# Patient Record
Sex: Female | Born: 1950 | Race: White | Hispanic: No | Marital: Single | Smoking: Never smoker
Health system: Southern US, Community
[De-identification: ages and names within clinical notes are randomized; demographics above are authoritative.]

## PROBLEM LIST (undated history)

## (undated) DIAGNOSIS — R42 Dizziness and giddiness: Secondary | ICD-10-CM

## (undated) DIAGNOSIS — C801 Malignant (primary) neoplasm, unspecified: Secondary | ICD-10-CM

## (undated) DIAGNOSIS — C539 Malignant neoplasm of cervix uteri, unspecified: Secondary | ICD-10-CM

## (undated) DIAGNOSIS — R011 Cardiac murmur, unspecified: Secondary | ICD-10-CM

## (undated) HISTORY — PX: OTHER SURGICAL HISTORY: SHX169

## (undated) HISTORY — PX: ABDOMINAL HYSTERECTOMY: SHX81

---

## 2005-11-15 ENCOUNTER — Emergency Department (HOSPITAL_COMMUNITY): Admission: EM | Admit: 2005-11-15 | Discharge: 2005-11-15 | Payer: Self-pay | Admitting: Emergency Medicine

## 2019-06-04 ENCOUNTER — Emergency Department: Payer: BLUE CROSS/BLUE SHIELD

## 2019-06-04 ENCOUNTER — Emergency Department
Admission: EM | Admit: 2019-06-04 | Discharge: 2019-06-04 | Disposition: A | Discharge status: Home / Self Care | Payer: BLUE CROSS/BLUE SHIELD | Attending: Emergency Medicine | Admitting: Emergency Medicine

## 2019-06-04 ENCOUNTER — Other Ambulatory Visit: Payer: Self-pay

## 2019-06-04 DIAGNOSIS — M79605 Pain in left leg: Secondary | ICD-10-CM | POA: Diagnosis present

## 2019-06-04 MED ORDER — CYCLOBENZAPRINE HCL 10 MG PO TABS
10.0000 mg | ORAL_TABLET | Freq: Once | ORAL | Status: AC
  Administered 2019-06-04: 10 mg via ORAL
  Filled 2019-06-04: qty 1

## 2019-06-04 MED ORDER — KETOROLAC TROMETHAMINE 60 MG/2ML IM SOLN
30.0000 mg | Freq: Once | INTRAMUSCULAR | Status: AC
  Administered 2019-06-04: 23:00:00 30 mg via INTRAMUSCULAR
  Filled 2019-06-04: qty 2

## 2019-06-04 MED ORDER — IBUPROFEN 600 MG PO TABS: 600.0000 mg | ORAL_TABLET | Freq: Three times a day (TID) | ORAL | 0 refills | Status: DC | PRN

## 2019-06-04 MED ORDER — HYDROMORPHONE HCL 1 MG/ML IJ SOLN
1.0000 mg | Freq: Once | INTRAMUSCULAR | Status: AC
  Administered 2019-06-04: 1 mg via INTRAMUSCULAR
  Filled 2019-06-04: qty 1

## 2019-06-04 MED ORDER — TRAMADOL HCL 50 MG PO TABS
50.0000 mg | ORAL_TABLET | Freq: Once | ORAL | Status: AC
  Administered 2019-06-04: 50 mg via ORAL
  Filled 2019-06-04: qty 1

## 2019-06-04 MED ORDER — TRAMADOL HCL 50 MG PO TABS: 50.0000 mg | ORAL_TABLET | Freq: Two times a day (BID) | ORAL | 0 refills | Status: DC | PRN

## 2019-06-04 NOTE — ED Provider Notes (Signed)
[facility] Emergency Department Provider Note   ____________________________________________   First MD Initiated Contact with Patient 06/04/19 2009     (approximate)  I have reviewed the triage vital signs and the nursing notes.   HISTORY  Chief Complaint Leg Pain    HPI Gina Charles is a 68 y.o. female patient presents with increasing anterior leg pain that radiates from foot to thigh.  Patient states pain increased with prolonged standing.  Patient stated no relief with taking anti-inflammatory medication.  Patient denies dyspnea or shortness of breath.  It was noted in triage the patient blood pressure is 195/90.  Patient is no history of hypertension.  We will have the nurse retake blood pressure.  Patient rates her pain as a 10/10.  Patient described the pain is "achy".  No other palliative measure for complaint.         No past medical history on file.  There are no active problems to display for this patient.     Prior to Admission medications   Medication Sig Start Date End Date Taking? Authorizing Provider  ibuprofen (ADVIL) 600 MG tablet Take 1 tablet (600 mg total) by mouth every 8 (eight) hours as needed. 06/04/19   Sable Feil, PA-C  traMADol (ULTRAM) 50 MG tablet Take 1 tablet (50 mg total) by mouth every 12 (twelve) hours as needed. 06/04/19   Sable Feil, PA-C    Allergies Patient has no allergy information on record.  No family history on file.  Social History Social History   Tobacco Use  . Smoking status: Not on file  Substance Use Topics  . Alcohol use: Not on file  . Drug use: Not on file    Review of Systems Constitutional: No fever/chills Eyes: No visual changes. ENT: No sore throat. Cardiovascular: Denies chest pain. Respiratory: Denies shortness of breath. Gastrointestinal: No abdominal pain.  No nausea, no vomiting.  No diarrhea.  No constipation. Genitourinary: Negative for dysuria.  Musculoskeletal: Negative for back pain. Skin: Negative for rash. Neurological: Negative for headaches, focal weakness or numbness.   ____________________________________________   PHYSICAL EXAM:  VITAL SIGNS: ED Triage Vitals  Enc Vitals Group     BP 06/04/19 1943 (!) 195/90     Pulse Rate 06/04/19 1943 82     Resp 06/04/19 1943 20     Temp 06/04/19 1943 98.3 F (36.8 C)     Temp Source 06/04/19 1943 Oral     SpO2 06/04/19 1943 100 %     Weight 06/04/19 1944 175 lb (79.4 kg)     Height 06/04/19 1944 5\' 4"  (1.626 m)     Head Circumference --      Peak Flow --      Pain Score 06/04/19 1944 10     Pain Loc --      Pain Edu? --      Excl. in [place]? --     Constitutional: Alert and oriented. Well appearing and in no acute distress. Cardiovascular: Normal rate, regular rhythm. Grossly normal heart sounds.  Good peripheral circulation.  Elevated blood pressure Respiratory: Normal respiratory effort.  No retractions. Lungs CTAB. Gastrointestinal: Soft and nontender. No distention. No abdominal bruits. No CVA tenderness. Musculoskeletal: No lower extremity tenderness nor edema.  No joint effusions. Neurologic:  Normal speech and language. No gross focal neurologic deficits are appreciated. No gait instability. Skin:  Skin is warm, dry and intact. No rash noted. Psychiatric: Mood and affect are normal. Speech and  behavior are normal.  ____________________________________________   LABS (all labs ordered are listed, but only abnormal results are displayed)  Labs Reviewed - No data to display ____________________________________________  EKG   ____________________________________________  RADIOLOGY  ED MD interpretation:    Official radiology report(s): US Venous Img Lower Unilateral Left  Result Date: 06/04/2019 CLINICAL DATA:  Left leg pain for 1 day EXAM: LEFT LOWER EXTREMITY VENOUS DOPPLER ULTRASOUND TECHNIQUE: Gray-scale sonography with graded compression, as well  as color Doppler and duplex ultrasound were performed to evaluate the lower extremity deep venous systems from the level of the common femoral vein and including the common femoral, femoral, profunda femoral, popliteal and calf veins including the posterior tibial, peroneal and gastrocnemius veins when visible. The superficial great saphenous vein was also interrogated. Spectral Doppler was utilized to evaluate flow at rest and with distal augmentation maneuvers in the common femoral, femoral and popliteal veins. COMPARISON:  None. FINDINGS: Contralateral Common Femoral Vein: Respiratory phasicity is normal and symmetric with the symptomatic side. No evidence of thrombus. Normal compressibility. Common Femoral Vein: No evidence of thrombus. Normal compressibility, respiratory phasicity and response to augmentation. Saphenofemoral Junction: No evidence of thrombus. Normal compressibility and flow on color Doppler imaging. Profunda Femoral Vein: No evidence of thrombus. Normal compressibility and flow on color Doppler imaging. Femoral Vein: No evidence of thrombus. Normal compressibility, respiratory phasicity and response to augmentation. Popliteal Vein: No evidence of thrombus. Normal compressibility, respiratory phasicity and response to augmentation. Calf Veins: No evidence of thrombus. Normal compressibility and flow on color Doppler imaging. Superficial Great Saphenous Vein: No evidence of thrombus. Normal compressibility. Venous Reflux:  None. Other Findings:  None. IMPRESSION: No evidence of deep venous thrombosis. Electronically Signed   By: Inez Catalina M.D.   On: 06/04/2019 22:17    ____________________________________________   PROCEDURES  Procedure(s) performed (including Critical Care):  Procedures   ____________________________________________   INITIAL IMPRESSION / ASSESSMENT AND PLAN / ED COURSE  As part of my medical decision making, I reviewed the following data within the  Swartz was evaluated in Emergency Department on 06/04/2019 for the symptoms described in the history of present illness. She was evaluated in the context of the global COVID-19 pandemic, which necessitated consideration that the patient might be at risk for infection with the SARS-CoV-2 virus that causes COVID-19. Institutional protocols and algorithms that pertain to the evaluation of patients at risk for COVID-19 are in a state of rapid change based on information released by regulatory bodies including the CDC and federal and state organizations. These policies and algorithms were followed during the patient's care in the ED.  Patient presents with left leg pain with concern for DVT secondary to family history.  Physical exam was grossly unremarkable except for moderate guarding palpation mid thigh and mid tib-fib.  Discussed negative ultrasound results with patient.  Patient given discharge care instructions and advised [facility] due to her elevated blood pressure.      ____________________________________________   FINAL CLINICAL IMPRESSION(S) / ED DIAGNOSES  Final diagnoses:  Left leg pain     ED Discharge Orders         Ordered    traMADol (ULTRAM) 50 MG tablet  Every 12 hours PRN     06/04/19 2230    ibuprofen (ADVIL) 600 MG tablet  Every 8 hours PRN     06/04/19 2230  Note:  This document was prepared using Dragon voice recognition software and may include unintentional dictation errors.    Sable Feil, PA-C 06/04/19 2236    Vanessa [place], MD 06/05/19 [phone]

## 2019-06-04 NOTE — ED Triage Notes (Signed)
Pt in with pain to left anterior leg from thigh to foot. No injury or history of the same. States took ibuprofen at work without relief. Denies any back pain, pt is ambulatory.

## 2019-06-29 ENCOUNTER — Encounter: Payer: Self-pay | Admitting: *Deleted

## 2019-07-06 ENCOUNTER — Other Ambulatory Visit: Payer: Self-pay

## 2019-07-06 ENCOUNTER — Other Ambulatory Visit
Admission: RE | Admit: 2019-07-06 | Discharge: 2019-07-06 | Disposition: A | Discharge status: Home / Self Care | Payer: BLUE CROSS/BLUE SHIELD | Source: Ambulatory Visit | Attending: Ophthalmology | Admitting: Ophthalmology

## 2019-07-06 DIAGNOSIS — Z01812 Encounter for preprocedural laboratory examination: Secondary | ICD-10-CM | POA: Diagnosis present

## 2019-07-06 DIAGNOSIS — Z20828 Contact with and (suspected) exposure to other viral communicable diseases: Secondary | ICD-10-CM | POA: Diagnosis not present

## 2019-07-07 LAB — SARS CORONAVIRUS 2 (TAT 6-24 HRS): SARS Coronavirus 2: NEGATIVE

## 2019-07-08 ENCOUNTER — Encounter: Payer: Self-pay | Admitting: Anesthesiology

## 2019-07-09 ENCOUNTER — Encounter: Payer: Self-pay | Admitting: *Deleted

## 2019-07-09 ENCOUNTER — Ambulatory Visit: Payer: BLUE CROSS/BLUE SHIELD | Admitting: Anesthesiology

## 2019-07-09 ENCOUNTER — Encounter
Admission: RE | Disposition: A | Discharge status: Home / Self Care | Payer: Self-pay | Source: Home / Self Care | Attending: Ophthalmology

## 2019-07-09 ENCOUNTER — Other Ambulatory Visit: Payer: Self-pay

## 2019-07-09 ENCOUNTER — Ambulatory Visit
Admission: RE | Admit: 2019-07-09 | Discharge: 2019-07-09 | Disposition: A | Discharge status: Home / Self Care | Payer: BLUE CROSS/BLUE SHIELD | Attending: Ophthalmology | Admitting: Ophthalmology

## 2019-07-09 ENCOUNTER — Ambulatory Visit: Admit: 2019-07-09 | Payer: Self-pay

## 2019-07-09 DIAGNOSIS — H2511 Age-related nuclear cataract, right eye: Secondary | ICD-10-CM | POA: Insufficient documentation

## 2019-07-09 DIAGNOSIS — Z8541 Personal history of malignant neoplasm of cervix uteri: Secondary | ICD-10-CM | POA: Insufficient documentation

## 2019-07-09 HISTORY — DX: Malignant (primary) neoplasm, unspecified: C80.1

## 2019-07-09 HISTORY — DX: Dizziness and giddiness: R42

## 2019-07-09 HISTORY — DX: Cardiac murmur, unspecified: R01.1

## 2019-07-09 HISTORY — PX: CATARACT EXTRACTION W/PHACO: SHX586

## 2019-07-09 HISTORY — DX: Malignant neoplasm of cervix uteri, unspecified: C53.9

## 2019-07-09 SURGERY — PHACOEMULSIFICATION, CATARACT, WITH IOL INSERTION
Anesthesia: Choice | Laterality: Right

## 2019-07-09 SURGERY — PHACOEMULSIFICATION, CATARACT, WITH IOL INSERTION
Anesthesia: Monitor Anesthesia Care | Site: Eye | Laterality: Right

## 2019-07-09 MED ORDER — TETRACAINE HCL 0.5 % OP SOLN
1.0000 [drp] | OPHTHALMIC | Status: AC | PRN
  Administered 2019-07-09 (×2): 1 [drp] via OPHTHALMIC

## 2019-07-09 MED ORDER — TETRACAINE HCL 0.5 % OP SOLN
OPHTHALMIC | Status: AC
  Administered 2019-07-09: 1 [drp] via OPHTHALMIC
  Filled 2019-07-09: qty 4

## 2019-07-09 MED ORDER — EPINEPHRINE PF 1 MG/ML IJ SOLN
INTRAOCULAR | Status: DC | PRN
  Administered 2019-07-09: 08:00:00 via OPHTHALMIC

## 2019-07-09 MED ORDER — POVIDONE-IODINE 5 % OP SOLN
OPHTHALMIC | Status: DC | PRN
  Administered 2019-07-09: 1 via OPHTHALMIC

## 2019-07-09 MED ORDER — NA CHONDROIT SULF-NA HYALURON 40-17 MG/ML IO SOLN
INTRAOCULAR | Status: DC | PRN
  Administered 2019-07-09: 1 mL via INTRAOCULAR

## 2019-07-09 MED ORDER — MOXIFLOXACIN HCL 0.5 % OP SOLN
OPHTHALMIC | Status: DC | PRN
  Administered 2019-07-09: 0.2 mL

## 2019-07-09 MED ORDER — MIDAZOLAM HCL 2 MG/2ML IJ SOLN
INTRAMUSCULAR | Status: DC | PRN
  Administered 2019-07-09: 2 mg via INTRAVENOUS

## 2019-07-09 MED ORDER — CARBACHOL 0.01 % IO SOLN
INTRAOCULAR | Status: DC | PRN
  Administered 2019-07-09: 0.5 mL via INTRAOCULAR

## 2019-07-09 MED ORDER — NA HYALUR & NA CHOND-NA HYALUR 0.55-0.5 ML IO KIT
PACK | INTRAOCULAR | Status: DC | PRN
  Administered 2019-07-09: 1 via OPHTHALMIC

## 2019-07-09 MED ORDER — MOXIFLOXACIN HCL 0.5 % OP SOLN: 1.0000 [drp] | OPHTHALMIC | Status: DC | PRN

## 2019-07-09 MED ORDER — DEXMEDETOMIDINE HCL 200 MCG/2ML IV SOLN
INTRAVENOUS | Status: DC | PRN
  Administered 2019-07-09: 12 ug via INTRAVENOUS

## 2019-07-09 MED ORDER — MIDAZOLAM HCL 2 MG/2ML IJ SOLN
INTRAMUSCULAR | Status: AC
  Filled 2019-07-09: qty 2

## 2019-07-09 MED ORDER — LIDOCAINE HCL (PF) 4 % IJ SOLN
INTRAOCULAR | Status: DC | PRN
  Administered 2019-07-09: 4 mL

## 2019-07-09 MED ORDER — ARMC OPHTHALMIC DILATING DROPS
OPHTHALMIC | Status: AC
  Administered 2019-07-09: 1 via OPHTHALMIC
  Filled 2019-07-09: qty 0.5

## 2019-07-09 MED ORDER — SODIUM CHLORIDE 0.9 % IV SOLN
INTRAVENOUS | Status: DC
  Administered 2019-07-09: 08:00:00 via INTRAVENOUS

## 2019-07-09 MED ORDER — ARMC OPHTHALMIC DILATING DROPS
1.0000 "application " | OPHTHALMIC | Status: AC
  Administered 2019-07-09 (×3): 1 via OPHTHALMIC

## 2019-07-09 MED ORDER — TRYPAN BLUE 0.06 % OP SOLN
OPHTHALMIC | Status: DC | PRN
  Administered 2019-07-09: 0.5 mL via INTRAOCULAR

## 2019-07-09 MED ORDER — MOXIFLOXACIN HCL 0.5 % OP SOLN
OPHTHALMIC | Status: AC
  Filled 2019-07-09: qty 3

## 2019-07-09 SURGICAL SUPPLY — 17 items
DISSECTOR HYDRO NUCLEUS 50X22 (MISCELLANEOUS) ×12 IMPLANT
DRSG TEGADERM 2-3/8X2-3/4 SM (GAUZE/BANDAGES/DRESSINGS) ×3 IMPLANT
GLOVE BIOGEL M 6.5 STRL (GLOVE) ×3 IMPLANT
GOWN STRL REUS W/ TWL LRG LVL3 (GOWN DISPOSABLE) ×1 IMPLANT
GOWN STRL REUS W/ TWL XL LVL3 (GOWN DISPOSABLE) ×1 IMPLANT
GOWN STRL REUS W/TWL LRG LVL3 (GOWN DISPOSABLE) ×3
GOWN STRL REUS W/TWL XL LVL3 (GOWN DISPOSABLE) ×3
KNIFE 45D UP 2.3 (MISCELLANEOUS) ×3 IMPLANT
LABEL CATARACT MEDS ST (LABEL) ×3 IMPLANT
LENS IOL TECNIS ITEC 23.5 (Intraocular Lens) ×2 IMPLANT
PACK CATARACT (MISCELLANEOUS) ×3 IMPLANT
PACK CATARACT KING (MISCELLANEOUS) ×3 IMPLANT
PACK EYE AFTER SURG (MISCELLANEOUS) ×3 IMPLANT
SOL BSS BAG (MISCELLANEOUS) ×3
SOLUTION BSS BAG (MISCELLANEOUS) ×1 IMPLANT
WATER STERILE IRR 250ML POUR (IV SOLUTION) ×3 IMPLANT
WIPE NON LINTING 3.25X3.25 (MISCELLANEOUS) ×3 IMPLANT

## 2019-07-09 NOTE — Anesthesia Postprocedure Evaluation (Signed)
Anesthesia Post Note  Patient: Gina Charles  Procedure(s) Performed: CATARACT EXTRACTION PHACO AND INTRAOCULAR LENS PLACEMENT (IOC) (Right Eye)  Patient location during evaluation: PACU Anesthesia Type: MAC Level of consciousness: awake and alert Pain management: pain level controlled Vital Signs Assessment: post-procedure vital signs reviewed and stable Respiratory status: spontaneous breathing, nonlabored ventilation, respiratory function stable and patient connected to nasal cannula oxygen Cardiovascular status: stable and blood pressure returned to baseline Postop Assessment: no apparent nausea or vomiting Anesthetic complications: no     Last Vitals:  Vitals:   07/09/19 0706 07/09/19 0857  BP: (!) 174/94   Pulse: 62 (!) 56  Resp: 16 16  Temp: 36.4 C (!) 36.4 C  SpO2: 100% 98%    Last Pain:  Vitals:   07/09/19 0857  TempSrc: Temporal  PainSc: 0-No pain                 Vandana Haman S

## 2019-07-09 NOTE — H&P (Signed)
   I have reviewed the patient's H&P and agree with its findings. There have been no interval changes.  Merlene Dante MD Ophthalmology 

## 2019-07-09 NOTE — Transfer of Care (Signed)
Immediate Anesthesia Transfer of Care Note  Patient: Gina Charles  Procedure(s) Performed: CATARACT EXTRACTION PHACO AND INTRAOCULAR LENS PLACEMENT (IOC) (Right Eye)  Patient Location: PACU  Anesthesia Type:MAC  Level of Consciousness: sedated  Airway & Oxygen Therapy: Patient Spontanous Breathing and Patient connected to nasal cannula oxygen  Post-op Assessment: Report given to RN and Post -op Vital signs reviewed and stable  Post vital signs: Reviewed and stable  Last Vitals:  Vitals Value Taken Time  BP    Temp    Pulse    Resp    SpO2      Last Pain:  Vitals:   07/09/19 0706  TempSrc: Tympanic  PainSc: 0-No pain         Complications: No apparent anesthesia complications

## 2019-07-09 NOTE — Discharge Instructions (Addendum)
Eye Surgery Discharge Instructions  Expect mild scratchy sensation or mild soreness. DO NOT RUB YOUR EYE!  The day of surgery:  Minimal physical activity, but bed rest is not required  No reading, computer work, or close hand work  No bending, lifting, or straining.  May watch TV  For 24 hours:  No driving, legal decisions, or alcoholic beverages  Safety precautions  Eat anything you prefer: It is better to start with liquids, then soup then solid foods.  Solar shield eyeglasses should be worn for comfort in the sunlight/patch while sleeping  Resume all regular medications including aspirin or Coumadin if these were discontinued prior to surgery. You may shower, bathe, shave, or wash your hair. Tylenol may be taken for mild discomfort. Follow eye drop instruction sheet as reviewed.  Call your doctor if you experience significant pain, nausea, or vomiting, fever > 101 or other signs of infection. [phone] or [phone] Specific instructions:  Follow-up Information    Marchia Meiers, MD Follow up.   Specialty: Ophthalmology Why: 07/10/19 @ 10:15 am Contact information: [address] [place] [address] [phone]

## 2019-07-09 NOTE — Anesthesia Preprocedure Evaluation (Addendum)
Anesthesia Evaluation  Patient identified by MRN, date of birth, ID band Patient awake    Reviewed: Allergy & Precautions, NPO status , Patient's Chart, lab work & pertinent test results, reviewed documented beta blocker date and time   Airway Mallampati: III  TM Distance: >3 FB     Dental  (+) Chipped   Pulmonary           Cardiovascular + Valvular Problems/Murmurs      Neuro/Psych    GI/Hepatic   Endo/Other    Renal/GU      Musculoskeletal   Abdominal   Peds  Hematology   Anesthesia Other Findings   Reproductive/Obstetrics                            Anesthesia Physical Anesthesia Plan  ASA: II  Anesthesia Plan: MAC   Post-op Pain Management:    Induction: Intravenous  PONV Risk Score and Plan:   Airway Management Planned:   Additional Equipment:   Intra-op Plan:   Post-operative Plan:   Informed Consent: I have reviewed the patients History and Physical, chart, labs and discussed the procedure including the risks, benefits and alternatives for the proposed anesthesia with the patient or authorized representative who has indicated his/her understanding and acceptance.       Plan Discussed with: CRNA  Anesthesia Plan Comments:        Anesthesia Quick Evaluation

## 2019-07-09 NOTE — Op Note (Signed)
  PREOPERATIVE DIAGNOSIS:  Nuclear sclerotic cataract of the RIGHT eye.   POSTOPERATIVE DIAGNOSIS:  Nuclear sclerotic cataract of the RIGHT eye.   OPERATIVE PROCEDURE: Cataract surgery OD   SURGEON:  Marchia Meiers, MD.   ANESTHESIA:  Anesthesiologist: Gunnar Bulla, MD CRNA: Justus Memory, CRNA  1.      Managed anesthesia care. 2.     0.13ml of Shugarcaine was instilled following the paracentesis   COMPLICATIONS:  None.   TECHNIQUE:   Divide and conquer   DESCRIPTION OF PROCEDURE:  The patient was examined and consented in the preoperative holding area where the aforementioned topical anesthesia was applied to the RIGHT eye and then brought back to the Operating Room where the RIGHT eye was prepped and draped in the usual sterile ophthalmic fashion and a lid speculum was placed. A paracentesis was created with the side port blade, the anterior chamber was washed out with trypan blue to stain the anterior capsule, and the anterior chamber was filled with viscoelastic. A near clear corneal incision was performed with the steel keratome. A continuous curvilinear capsulorrhexis was performed with a cystotome followed by the capsulorrhexis forceps. Hydrodissection and hydrodelineation were carried out with BSS on a blunt cannula. The lens was removed in a divide and conquer  technique and the remaining cortical material was removed with the irrigation-aspiration handpiece. The capsular bag was inflated with viscoelastic and the lens was placed in the capsular bag without complication. The remaining viscoelastic was removed from the eye with the irrigation-aspiration handpiece. The wounds were hydrated. The anterior chamber was flushed and the eye was inflated to physiologic pressure. 0.91ml Vigamox was placed in the anterior chamber. The wounds were found to be water tight. The eye was dressed with Vigamox. The patient was given protective glasses to wear throughout the day and a shield with which  to sleep tonight. The patient was also given drops with which to begin a drop regimen today and will follow-up with me in one day. Implant Name Type Inv. Item Serial No. Manufacturer Lot No. LRB No. Used Action  LENS IOL DIOP 23.5 - [id number] Intraocular Lens LENS IOL DIOP 23.5 [id number] 2008 AMO  Right 1 Implanted    Procedure(s) with comments: CATARACT EXTRACTION PHACO AND INTRAOCULAR LENS PLACEMENT (IOC) (Right) - [place] 00:55.1 CDE 10.40 Fluid Pack Lot # [id number]  Electronically signed: Marchia Meiers 07/09/2019 11:05 AM

## 2019-07-09 NOTE — Anesthesia Post-op Follow-up Note (Signed)
Anesthesia QCDR form completed.        

## 2019-07-22 ENCOUNTER — Encounter: Payer: Self-pay | Admitting: *Deleted

## 2019-07-27 ENCOUNTER — Other Ambulatory Visit: Payer: Self-pay

## 2019-07-27 ENCOUNTER — Other Ambulatory Visit
Admission: RE | Admit: 2019-07-27 | Discharge: 2019-07-27 | Disposition: A | Discharge status: Home / Self Care | Payer: BLUE CROSS/BLUE SHIELD | Source: Ambulatory Visit | Attending: Ophthalmology | Admitting: Ophthalmology

## 2019-07-27 DIAGNOSIS — Z01812 Encounter for preprocedural laboratory examination: Secondary | ICD-10-CM | POA: Diagnosis not present

## 2019-07-27 DIAGNOSIS — Z20828 Contact with and (suspected) exposure to other viral communicable diseases: Secondary | ICD-10-CM | POA: Diagnosis not present

## 2019-07-27 LAB — SARS CORONAVIRUS 2 (TAT 6-24 HRS): SARS Coronavirus 2: NEGATIVE

## 2019-07-30 ENCOUNTER — Ambulatory Visit: Payer: BLUE CROSS/BLUE SHIELD | Admitting: Anesthesiology

## 2019-07-30 ENCOUNTER — Encounter: Payer: Self-pay | Admitting: *Deleted

## 2019-07-30 ENCOUNTER — Other Ambulatory Visit: Payer: Self-pay

## 2019-07-30 ENCOUNTER — Ambulatory Visit
Admission: RE | Admit: 2019-07-30 | Discharge: 2019-07-30 | Disposition: A | Discharge status: Home / Self Care | Payer: BLUE CROSS/BLUE SHIELD | Attending: Ophthalmology | Admitting: Ophthalmology

## 2019-07-30 ENCOUNTER — Encounter
Admission: RE | Disposition: A | Discharge status: Home / Self Care | Payer: Self-pay | Source: Home / Self Care | Attending: Ophthalmology

## 2019-07-30 DIAGNOSIS — Z8541 Personal history of malignant neoplasm of cervix uteri: Secondary | ICD-10-CM | POA: Insufficient documentation

## 2019-07-30 DIAGNOSIS — H2512 Age-related nuclear cataract, left eye: Secondary | ICD-10-CM | POA: Diagnosis not present

## 2019-07-30 HISTORY — PX: CATARACT EXTRACTION W/PHACO: SHX586

## 2019-07-30 SURGERY — PHACOEMULSIFICATION, CATARACT, WITH IOL INSERTION
Anesthesia: Monitor Anesthesia Care | Site: Eye | Laterality: Left

## 2019-07-30 MED ORDER — MIDAZOLAM HCL 2 MG/2ML IJ SOLN
INTRAMUSCULAR | Status: AC
  Filled 2019-07-30: qty 2

## 2019-07-30 MED ORDER — NA HYALUR & NA CHOND-NA HYALUR 0.55-0.5 ML IO KIT
PACK | INTRAOCULAR | Status: DC | PRN
  Administered 2019-07-30: 1 via INTRAOCULAR

## 2019-07-30 MED ORDER — FENTANYL CITRATE (PF) 100 MCG/2ML IJ SOLN
INTRAMUSCULAR | Status: AC
  Filled 2019-07-30: qty 2

## 2019-07-30 MED ORDER — TRYPAN BLUE 0.06 % OP SOLN
OPHTHALMIC | Status: DC | PRN
  Administered 2019-07-30: 0.5 mL via INTRAOCULAR

## 2019-07-30 MED ORDER — NA CHONDROIT SULF-NA HYALURON 40-17 MG/ML IO SOLN
INTRAOCULAR | Status: DC | PRN
  Administered 2019-07-30: 1 mL via INTRAOCULAR

## 2019-07-30 MED ORDER — ARMC OPHTHALMIC DILATING DROPS
OPHTHALMIC | Status: AC
  Filled 2019-07-30: qty 0.5

## 2019-07-30 MED ORDER — ARMC OPHTHALMIC DILATING DROPS
1.0000 "application " | OPHTHALMIC | Status: AC
  Administered 2019-07-30 (×3): 1 via OPHTHALMIC

## 2019-07-30 MED ORDER — LIDOCAINE HCL (PF) 4 % IJ SOLN
INTRAOCULAR | Status: DC | PRN
  Administered 2019-07-30: 2.5 mL via OPHTHALMIC

## 2019-07-30 MED ORDER — FENTANYL CITRATE (PF) 250 MCG/5ML IJ SOLN: INTRAMUSCULAR | Status: DC | PRN

## 2019-07-30 MED ORDER — POVIDONE-IODINE 5 % OP SOLN
OPHTHALMIC | Status: DC | PRN
  Administered 2019-07-30: 1 via OPHTHALMIC

## 2019-07-30 MED ORDER — MOXIFLOXACIN HCL 0.5 % OP SOLN
OPHTHALMIC | Status: AC
  Filled 2019-07-30: qty 3

## 2019-07-30 MED ORDER — TETRACAINE HCL 0.5 % OP SOLN
1.0000 [drp] | Freq: Two times a day (BID) | OPHTHALMIC | Status: AC
  Administered 2019-07-30 (×2): 1 [drp] via OPHTHALMIC

## 2019-07-30 MED ORDER — TETRACAINE HCL 0.5 % OP SOLN
OPHTHALMIC | Status: AC
  Filled 2019-07-30: qty 4

## 2019-07-30 MED ORDER — ONDANSETRON HCL 4 MG/2ML IJ SOLN
INTRAMUSCULAR | Status: DC | PRN
  Administered 2019-07-30: 4 mg via INTRAVENOUS

## 2019-07-30 MED ORDER — MOXIFLOXACIN HCL 0.5 % OP SOLN
OPHTHALMIC | Status: DC | PRN
  Administered 2019-07-30: 0.2 mL via OPHTHALMIC

## 2019-07-30 MED ORDER — DEXMEDETOMIDINE HCL 200 MCG/2ML IV SOLN
INTRAVENOUS | Status: DC | PRN
  Administered 2019-07-30: 8 ug via INTRAVENOUS

## 2019-07-30 MED ORDER — MOXIFLOXACIN HCL 0.5 % OP SOLN: 1.0000 [drp] | Freq: Once | OPHTHALMIC | Status: DC

## 2019-07-30 MED ORDER — MIDAZOLAM HCL 2 MG/2ML IJ SOLN
INTRAMUSCULAR | Status: DC | PRN
  Administered 2019-07-30: 2 mg via INTRAVENOUS

## 2019-07-30 MED ORDER — SODIUM CHLORIDE 0.9 % IV SOLN
INTRAVENOUS | Status: DC
  Administered 2019-07-30: 08:00:00 via INTRAVENOUS

## 2019-07-30 MED ORDER — EPINEPHRINE PF 1 MG/ML IJ SOLN
INTRAOCULAR | Status: DC | PRN
  Administered 2019-07-30: 200 mL via OPHTHALMIC

## 2019-07-30 SURGICAL SUPPLY — 19 items
DISSECTOR HYDRO NUCLEUS 50X22 (MISCELLANEOUS) ×12 IMPLANT
DRSG TEGADERM 2-3/8X2-3/4 SM (GAUZE/BANDAGES/DRESSINGS) ×3 IMPLANT
GLOVE BIOGEL M 6.5 STRL (GLOVE) ×3 IMPLANT
GOWN STRL REUS W/ TWL LRG LVL3 (GOWN DISPOSABLE) ×1 IMPLANT
GOWN STRL REUS W/ TWL XL LVL3 (GOWN DISPOSABLE) ×1 IMPLANT
GOWN STRL REUS W/TWL LRG LVL3 (GOWN DISPOSABLE) ×3
GOWN STRL REUS W/TWL XL LVL3 (GOWN DISPOSABLE) ×3
KNIFE 45D UP 2.3 (MISCELLANEOUS) ×3 IMPLANT
LABEL CATARACT MEDS ST (LABEL) ×3 IMPLANT
LENS IOL TECNIS ITEC 23.5 (Intraocular Lens) ×2 IMPLANT
PACK CATARACT (MISCELLANEOUS) ×3 IMPLANT
PACK CATARACT KING (MISCELLANEOUS) ×3 IMPLANT
PACK EYE AFTER SURG (MISCELLANEOUS) ×3 IMPLANT
SOL BAL SALT 15ML (MISCELLANEOUS) ×3
SOL BSS BAG (MISCELLANEOUS) ×3
SOLUTION BAL SALT 15ML (MISCELLANEOUS) IMPLANT
SOLUTION BSS BAG (MISCELLANEOUS) ×1 IMPLANT
WATER STERILE IRR 250ML POUR (IV SOLUTION) ×3 IMPLANT
WIPE NON LINTING 3.25X3.25 (MISCELLANEOUS) ×3 IMPLANT

## 2019-07-30 NOTE — H&P (Addendum)
   I have reviewed the patient's H&P and agree with its findings. There have been no interval changes.  Kennia Vanvorst MD Ophthalmology 

## 2019-07-30 NOTE — Discharge Instructions (Signed)
Eye Surgery Discharge Instructions    Expect mild scratchy sensation or mild soreness. DO NOT RUB YOUR EYE!  The day of surgery:  Minimal physical activity, but bed rest is not required  No reading, computer work, or close hand work  No bending, lifting, or straining.  May watch TV  For 24 hours:  No driving, legal decisions, or alcoholic beverages  Safety precautions  Eat anything you prefer: It is better to start with liquids, then soup then solid foods.  _____ Eye patch should be worn until postoperative exam tomorrow.  ____ Solar shield eyeglasses should be worn for comfort in the sunlight/patch while sleeping  Resume all regular medications including aspirin or Coumadin if these were discontinued prior to surgery. You may shower, bathe, shave, or wash your hair. Tylenol may be taken for mild discomfort.  Call your doctor if you experience significant pain, nausea, or vomiting, fever > 101 or other signs of infection. [phone] or [phone] Specific instructions:  Follow-up Information    Marchia Meiers, MD Follow up on 07/31/2019.   Specialty: Ophthalmology Why: @ 8:50 am for post op visit Contact information: [place]  [id number] [phone]

## 2019-07-30 NOTE — Anesthesia Post-op Follow-up Note (Signed)
Anesthesia QCDR form completed.        

## 2019-07-30 NOTE — Anesthesia Preprocedure Evaluation (Signed)
Anesthesia Evaluation  Patient identified by MRN, date of birth, ID band Patient awake    Reviewed: Allergy & Precautions, H&P , NPO status , reviewed documented beta blocker date and time   Airway Mallampati: III  TM Distance: >3 FB Neck ROM: limited    Dental  (+) Upper Dentures, Lower Dentures   Pulmonary    Pulmonary exam normal        Cardiovascular Normal cardiovascular exam+ Valvular Problems/Murmurs      Neuro/Psych    GI/Hepatic neg GERD  ,  Endo/Other    Renal/GU      Musculoskeletal   Abdominal   Peds  Hematology   Anesthesia Other Findings Past Medical History: No date: Cancer ([place]) No date: Cervical cancer ([place]) No date: Heart murmur No date: Vertigo  Past Surgical History: No date: ABDOMINAL HYSTERECTOMY 07/09/2019: CATARACT EXTRACTION W/PHACO; Right     Comment:  Procedure: CATARACT EXTRACTION PHACO AND INTRAOCULAR               LENS PLACEMENT (IOC);  Surgeon: Marchia Meiers, MD;                Location: [facility] ORS;  Service: Ophthalmology;  Laterality:              Right;  [place] 00:55.1 CDE 10.40 Fluid Pack Lot # [id number] No date: cervical cancer surgery  BMI    Body Mass Index: 28.56 kg/m      Reproductive/Obstetrics                             Anesthesia Physical Anesthesia Plan  ASA: II  Anesthesia Plan: MAC   Post-op Pain Management:    Induction: Intravenous  PONV Risk Score and Plan: TIVA  Airway Management Planned: Nasal Cannula and Natural Airway  Additional Equipment:   Intra-op Plan:   Post-operative Plan:   Informed Consent: I have reviewed the patients History and Physical, chart, labs and discussed the procedure including the risks, benefits and alternatives for the proposed anesthesia with the patient or authorized representative who has indicated his/her understanding and acceptance.     Dental Advisory Given  Plan Discussed with:  CRNA  Anesthesia Plan Comments:         Anesthesia Quick Evaluation

## 2019-07-30 NOTE — Transfer of Care (Signed)
Immediate Anesthesia Transfer of Care Note  Patient: Gina Charles  Procedure(s) Performed: CATARACT EXTRACTION PHACO AND INTRAOCULAR LENS PLACEMENT (IOC) LEFT (Left Eye)  Patient Location: PACU  Anesthesia Type:MAC  Level of Consciousness: awake, alert  and oriented  Airway & Oxygen Therapy: Patient Spontanous Breathing  Post-op Assessment: Report given to RN  Post vital signs: Reviewed and stable  Last Vitals:  Vitals Value Taken Time  BP 140/62 07/30/19 1001  Temp 36 C 07/30/19 0956  Pulse 55 07/30/19 1001  Resp 14 07/30/19 1001  SpO2 99 % 07/30/19 1001    Last Pain:  Vitals:   07/30/19 0956  TempSrc: Temporal  PainSc: 0-No pain         Complications: No apparent anesthesia complications

## 2019-07-31 ENCOUNTER — Encounter: Payer: Self-pay | Admitting: Ophthalmology

## 2019-07-31 NOTE — Op Note (Signed)
  PREOPERATIVE DIAGNOSIS:  Nuclear sclerotic cataract of the LEFT eye.   POSTOPERATIVE DIAGNOSIS:  Nuclear sclerotic cataract of the LEFT eye.   OPERATIVE PROCEDURE: Cataract surgery OS   SURGEON:  Marchia Meiers, MD.   ANESTHESIA:  Anesthesiologist: Alphonsus Sias, MD CRNA: Disser, Dierdre Forth, CRNA  1.      Managed anesthesia care. 2.     0.23ml of Shugarcaine was instilled following the paracentesis   COMPLICATIONS:  None.   TECHNIQUE:   Divide and conquer   DESCRIPTION OF PROCEDURE:  The patient was examined and consented in the preoperative holding area where the aforementioned topical anesthesia was applied to the LEFT eye and then brought back to the Operating Room where the left eye was prepped and draped in the usual sterile ophthalmic fashion and a lid speculum was placed. A paracentesis was created with the side port blade, the anterior chamber was washed out with trypan blue to stain the anterior capsule, and the anterior chamber was filled with viscoelastic. A near clear corneal incision was performed with the steel keratome. A continuous curvilinear capsulorrhexis was performed with a cystotome followed by the capsulorrhexis forceps. Hydrodissection and hydrodelineation were carried out with BSS on a blunt cannula. The lens was removed in a divide and conquer  technique and the remaining cortical material was removed with the irrigation-aspiration handpiece. The capsular bag was inflated with viscoelastic and the lens was placed in the capsular bag without complication. The remaining viscoelastic was removed from the eye with the irrigation-aspiration handpiece. The wounds were hydrated. The anterior chamber was flushed and the eye was inflated to physiologic pressure. 0.66ml Vigamox was placed in the anterior chamber. The wounds were found to be water tight. The eye was dressed with Vigamox. The patient was given protective glasses to wear throughout the day and a shield with which to  sleep tonight. The patient was also given drops with which to begin a drop regimen today and will follow-up with me in one day. Implant Name Type Inv. Item Serial No. Manufacturer Lot No. LRB No. Used Action  LENS IOL DIOP 23.5 - [id number] Intraocular Lens LENS IOL DIOP 23.5 [id number] 2008 AMO  Left 1 Implanted    Procedure(s) with comments: CATARACT EXTRACTION PHACO AND INTRAOCULAR LENS PLACEMENT (IOC) LEFT (Left) - Lot [id number] H [place]: 00:44.5 CDE: 7.07  Electronically signed: Else Habermann 07/31/2019 9:02 AM

## 2019-08-06 NOTE — Anesthesia Postprocedure Evaluation (Signed)
Anesthesia Post Note  Patient: Chandel Ellinwood  Procedure(s) Performed: CATARACT EXTRACTION PHACO AND INTRAOCULAR LENS PLACEMENT (IOC) LEFT (Left Eye)  Patient location during evaluation: Phase II Anesthesia Type: MAC Level of consciousness: awake and alert Pain management: pain level controlled Vital Signs Assessment: post-procedure vital signs reviewed and stable Respiratory status: spontaneous breathing, nonlabored ventilation and respiratory function stable Cardiovascular status: blood pressure returned to baseline and stable Postop Assessment: no apparent nausea or vomiting Anesthetic complications: no     Last Vitals:  Vitals:   07/30/19 0956 07/30/19 1001  BP: (!) 141/67 140/62  Pulse: (!) 53 (!) 55  Resp: 16 14  Temp: (!) 36 C   SpO2: 99% 99%    Last Pain:  Vitals:   07/30/19 0956  TempSrc: Temporal  PainSc: 0-No pain                 Alphonsus Sias

## 2020-03-01 DIAGNOSIS — Z961 Presence of intraocular lens: Secondary | ICD-10-CM | POA: Diagnosis not present

## 2020-04-06 DIAGNOSIS — Z03818 Encounter for observation for suspected exposure to other biological agents ruled out: Secondary | ICD-10-CM | POA: Diagnosis not present

## 2020-04-06 DIAGNOSIS — R05 Cough: Secondary | ICD-10-CM | POA: Diagnosis not present

## 2020-08-10 IMAGING — US US EXTREM LOW VENOUS*L*
1 series · 13 of 24 positions shown · non-contrast
Comparison: None.

CLINICAL DATA: Left leg pain for 1 day



[Series 1: us extrem low venous*left* · 13 of 35 slices shown]
[im 1/35]
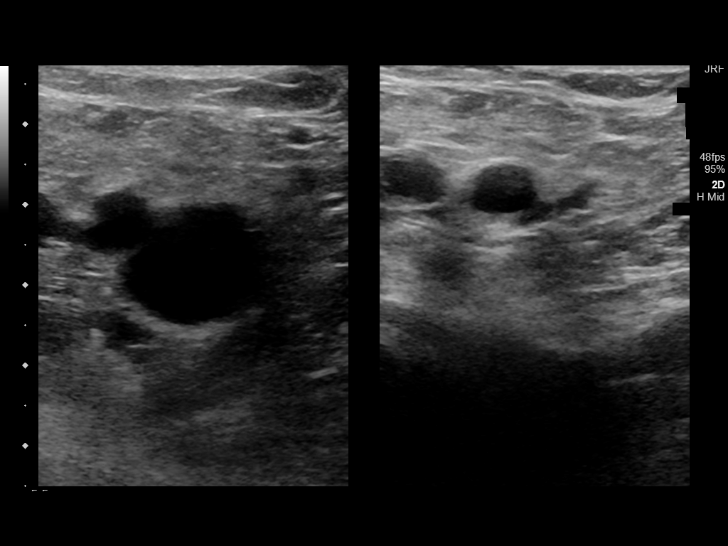
[im 3/35]
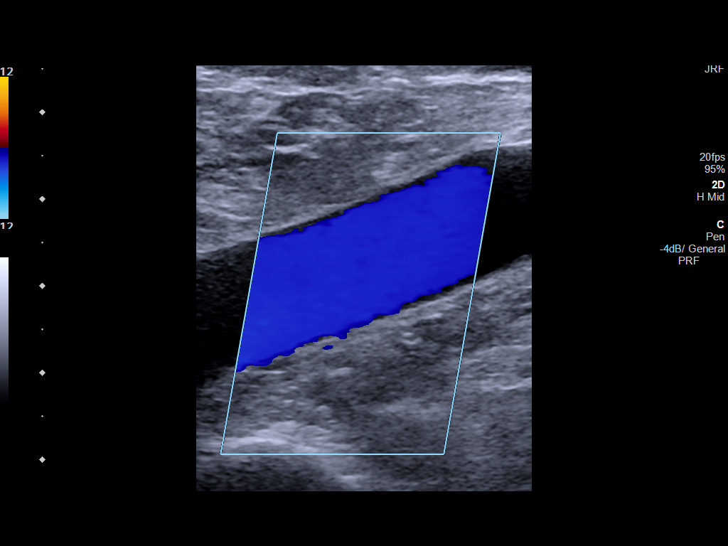
[im 6/35]
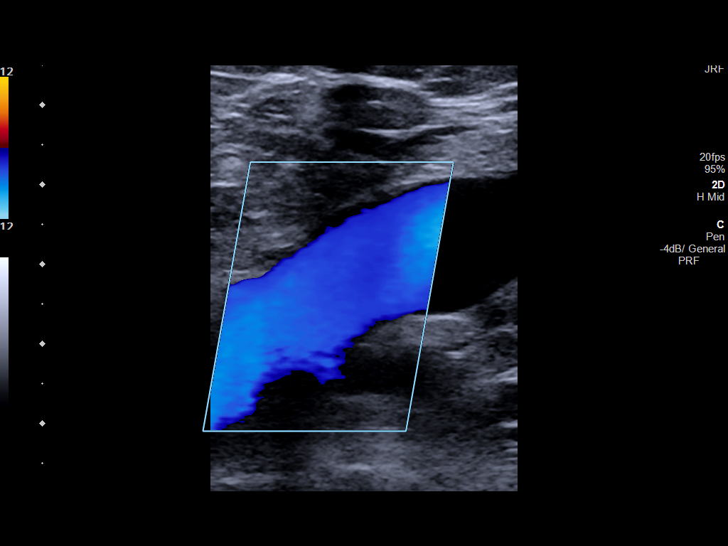
[im 9/35]
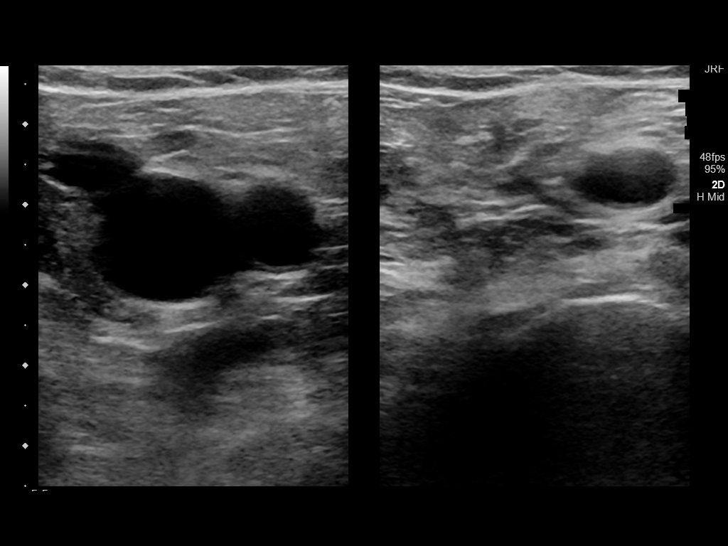
[im 12/35]
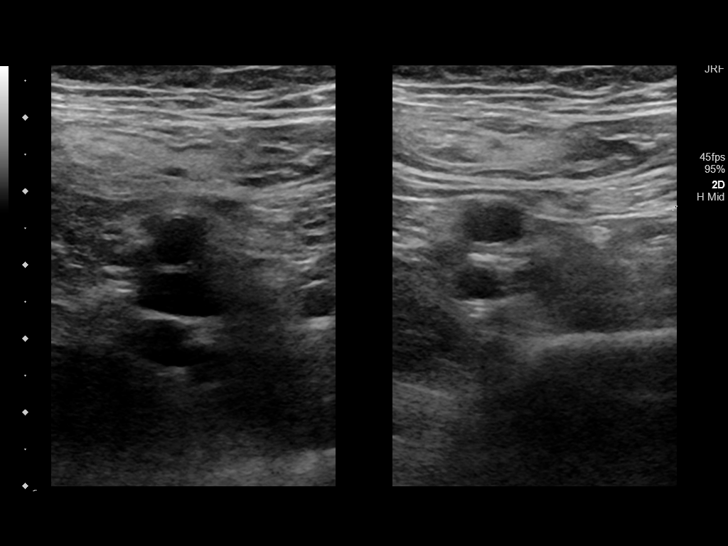
[im 15/35]
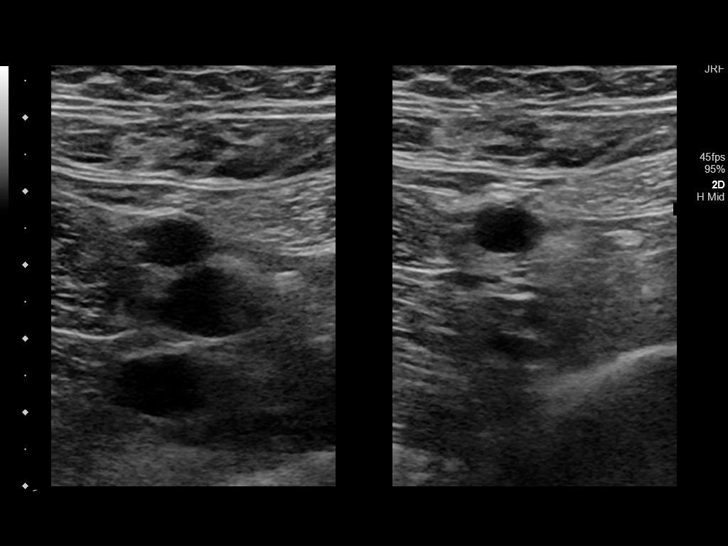
[im 18/35]
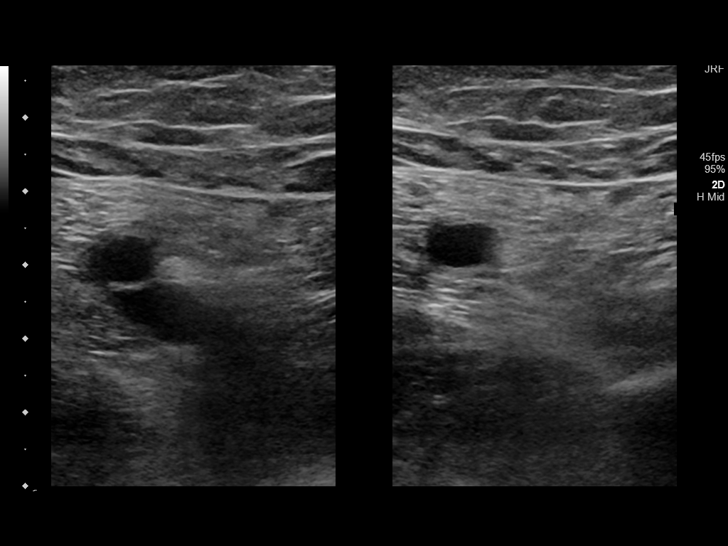
[im 20/35]
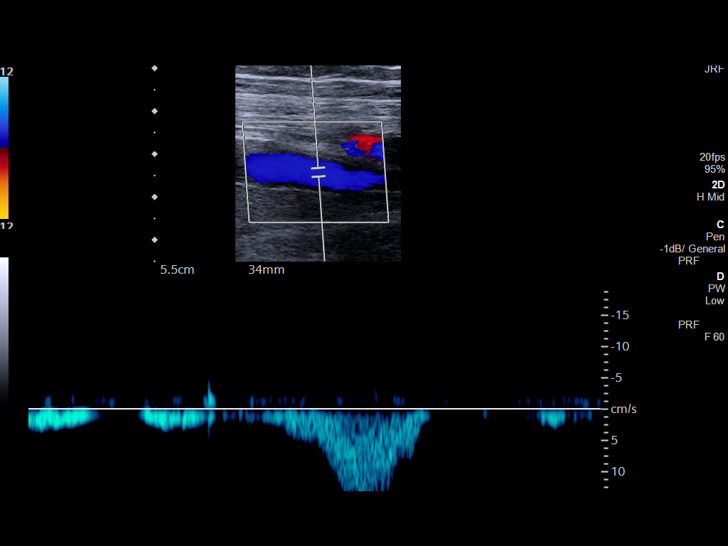
[im 23/35]
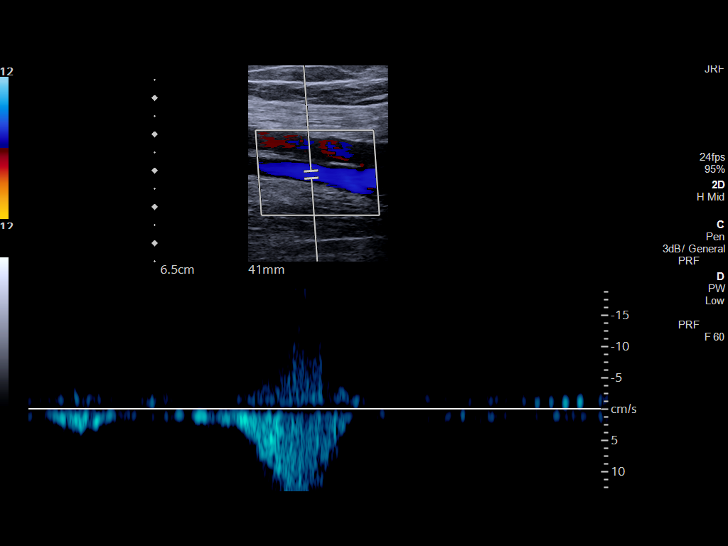
[im 26/35]
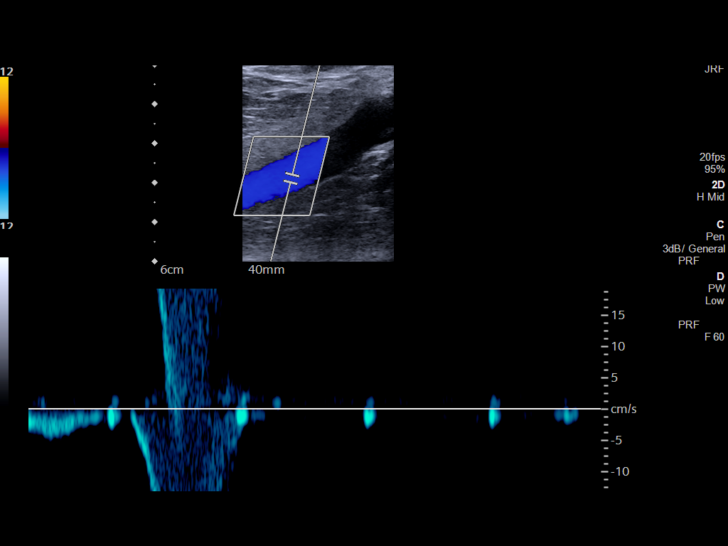
[im 29/35]
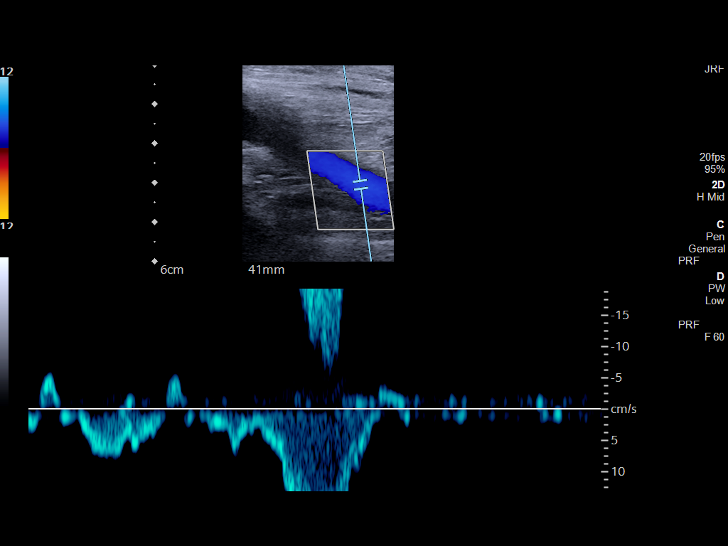
[im 32/35]
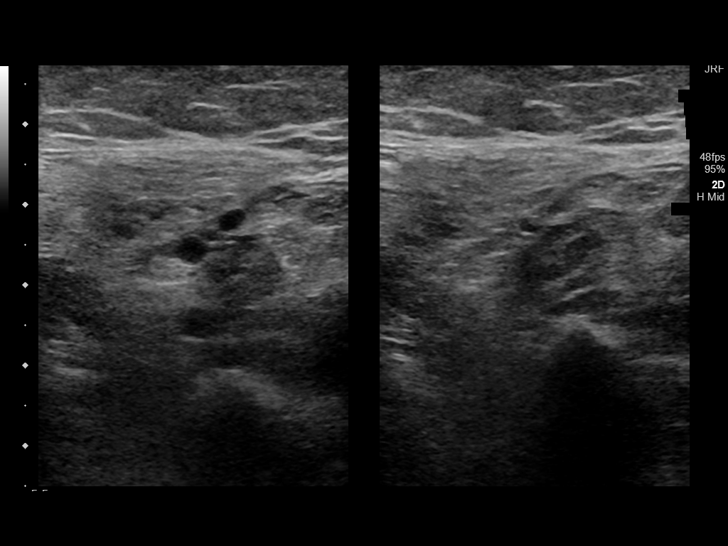
[im 35/35]
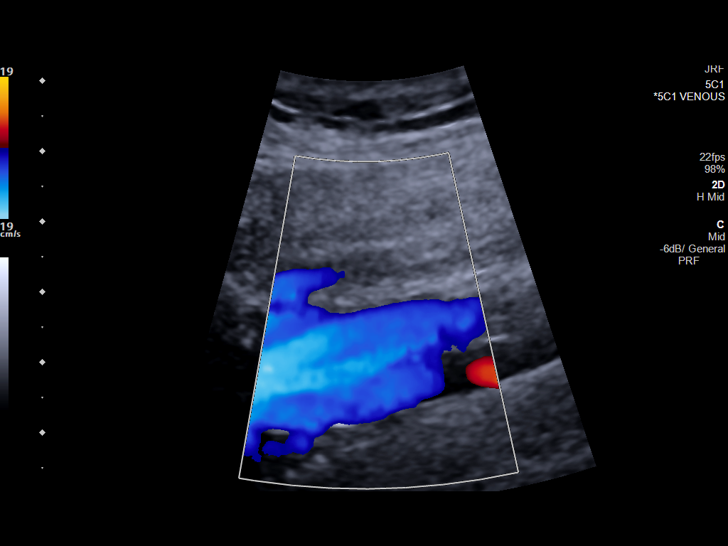

[13 of 24 positions shown; findings below may reference images not displayed]

FINDINGS: Contralateral Common Femoral Vein: Respiratory phasicity is normal
and symmetric with the symptomatic side. No evidence of thrombus.
Normal compressibility.

Common Femoral Vein: No evidence of thrombus. Normal
compressibility, respiratory phasicity and response to augmentation.

Saphenofemoral Junction: No evidence of thrombus. Normal
compressibility and flow on color Doppler imaging.

Profunda Femoral Vein: No evidence of thrombus. Normal
compressibility and flow on color Doppler imaging.

Femoral Vein: No evidence of thrombus. Normal compressibility,
respiratory phasicity and response to augmentation.

Popliteal Vein: No evidence of thrombus. Normal compressibility,
respiratory phasicity and response to augmentation.

Calf Veins: No evidence of thrombus. Normal compressibility and flow
on color Doppler imaging.

Superficial Great Saphenous Vein: No evidence of thrombus. Normal
compressibility.

Venous Reflux:  None.

Other Findings:  None.
IMPRESSION: No evidence of deep venous thrombosis.

## 2020-10-07 ENCOUNTER — Other Ambulatory Visit: Payer: Self-pay

## 2020-10-07 ENCOUNTER — Emergency Department
Admission: EM | Admit: 2020-10-07 | Discharge: 2020-10-07 | Disposition: A | Discharge status: Home / Self Care | Payer: 59 | Attending: Emergency Medicine | Admitting: Emergency Medicine

## 2020-10-07 ENCOUNTER — Emergency Department: Payer: 59

## 2020-10-07 ENCOUNTER — Encounter: Payer: Self-pay | Admitting: Emergency Medicine

## 2020-10-07 DIAGNOSIS — R0789 Other chest pain: Secondary | ICD-10-CM | POA: Diagnosis not present

## 2020-10-07 DIAGNOSIS — R1013 Epigastric pain: Secondary | ICD-10-CM | POA: Diagnosis not present

## 2020-10-07 DIAGNOSIS — Z8541 Personal history of malignant neoplasm of cervix uteri: Secondary | ICD-10-CM | POA: Diagnosis not present

## 2020-10-07 LAB — TROPONIN I (HIGH SENSITIVITY): Troponin I (High Sensitivity): 5 ng/L (ref <18)

## 2020-10-07 LAB — CBC
HCT: 41.8 % (ref 36.0–46.0)
Hemoglobin: 13.4 g/dL (ref 12.0–15.0)
MCH: 30.2 pg (ref 26.0–34.0)
MCHC: 32.1 g/dL (ref 30.0–36.0)
MCV: 94.4 fL (ref 80.0–100.0)
Platelets: 353 10*3/uL (ref 150–400)
RBC: 4.43 MIL/uL (ref 3.87–5.11)
RDW: 11.9 % (ref 11.5–15.5)
WBC: 7.8 10*3/uL (ref 4.0–10.5)
nRBC: 0 % (ref 0.0–0.2)

## 2020-10-07 LAB — BASIC METABOLIC PANEL
Anion gap: 13 (ref 5–15)
BUN: 20 mg/dL (ref 8–23)
CO2: 27 mmol/L (ref 22–32)
Calcium: 9.1 mg/dL (ref 8.9–10.3)
Chloride: 103 mmol/L (ref 98–111)
Creatinine, Ser: 1.04 mg/dL — ABNORMAL HIGH (ref 0.44–1.00)
GFR, Estimated: 58 mL/min — ABNORMAL LOW (ref >60)
Glucose, Bld: 74 mg/dL (ref 70–99)
Potassium: 3.9 mmol/L (ref 3.5–5.1)
Sodium: 143 mmol/L (ref 135–145)

## 2020-10-07 MED ORDER — PANTOPRAZOLE SODIUM 20 MG PO TBEC: 20.0000 mg | DELAYED_RELEASE_TABLET | Freq: Every day | ORAL | 1 refills | Status: AC

## 2020-10-07 MED ORDER — ALUM & MAG HYDROXIDE-SIMETH 200-200-20 MG/5ML PO SUSP
30.0000 mL | Freq: Once | ORAL | Status: AC
  Administered 2020-10-07: 30 mL via ORAL
  Filled 2020-10-07: qty 30

## 2020-10-07 MED ORDER — LIDOCAINE VISCOUS HCL 2 % MT SOLN
15.0000 mL | Freq: Once | OROMUCOSAL | Status: AC
  Administered 2020-10-07: 15 mL via ORAL
  Filled 2020-10-07: qty 15

## 2020-10-07 NOTE — ED Notes (Signed)
Pt states for the last several days epigastric/chest pain.

## 2020-10-07 NOTE — ED Triage Notes (Signed)
Pt to ED via POV stating that for the past 2 night she has been having pain in the center of her chest. Pt states that it feels like she has a cracked rib. Pt Pt denies Shob, N/V. Pt states that she has had 1 similar episode in the past but the pain went away. Pt is in NAD.

## 2020-10-07 NOTE — ED Provider Notes (Signed)
[facility] Emergency Department Provider Note   ____________________________________________    I have reviewed the triage vital signs and the nursing notes.   HISTORY  Chief Complaint Chest Pain     HPI Gina Charles is a 70 y.o. female with history as below who presents with complaints of epigastric discomfort which she describes as intermittently sharp and occasionally burning in nature.  Seems to be worse at night.  She has had this in the past but the pain went away without intervention.  Denies fevers chills or cough.  No shortness of breath.  Has not take anything for this.  Denies a history of heart disease  Past Medical History:  Diagnosis Date  . Cancer ([place])   . Cervical cancer ([place])   . Heart murmur   . Vertigo     There are no problems to display for this patient.   Past Surgical History:  Procedure Laterality Date  . ABDOMINAL HYSTERECTOMY    . CATARACT EXTRACTION W/PHACO Right 07/09/2019   Procedure: CATARACT EXTRACTION PHACO AND INTRAOCULAR LENS PLACEMENT (IOC);  Surgeon: Marchia Meiers, MD;  Location: [facility] ORS;  Service: Ophthalmology;  Laterality: Right;  [place] 00:55.1 CDE 10.40 Fluid Pack Lot # [id number]  . CATARACT EXTRACTION W/PHACO Left 07/30/2019   Procedure: CATARACT EXTRACTION PHACO AND INTRAOCULAR LENS PLACEMENT ([place]) LEFT;  Surgeon: Marchia Meiers, MD;  Location: [facility] ORS;  Service: Ophthalmology;  Laterality: Left;  Lot #[id number] H [place]: 00:44.5 CDE: 7.07  . cervical cancer surgery      Prior to Admission medications   Medication Sig Start Date End Date Taking? Authorizing Provider  pantoprazole (PROTONIX) 20 MG tablet Take 1 tablet (20 mg total) by mouth daily. 10/07/20 10/07/21 Yes Lavonia Drafts, MD  CALCIUM PO Take 1 capsule by mouth every other day.    [provider]  Carboxymethylcellul-Glycerin (LUBRICATING EYE DROPS OP) Place 1 drop into both eyes daily as needed (dry eyes).    [provider]   ibuprofen (ADVIL) 200 MG tablet Take 400 mg by mouth every 6 (six) hours as needed for headache or moderate pain.    [provider]  Multiple Vitamin (MULTIVITAMIN WITH MINERALS) TABS tablet Take 1 tablet by mouth daily.    [provider]     Allergies Patient has no known allergies.  No family history on file.  Social History Social History   Tobacco Use  . Smoking status: Never Smoker  . Smokeless tobacco: Never Used  Vaping Use  . Vaping Use: Never used  Substance Use Topics  . Alcohol use: Never  . Drug use: Never    Review of Systems  Constitutional: No fever/chills Eyes: No visual changes.  ENT: No sore throat. Cardiovascular: As above Respiratory: Denies shortness of breath. Gastrointestinal: As above Genitourinary: Negative for dysuria. Musculoskeletal: Negative for back pain. Skin: Negative for rash. Neurological: Negative for headaches or weakness   ____________________________________________   PHYSICAL EXAM:  VITAL SIGNS: ED Triage Vitals  Enc Vitals Group     BP 10/07/20 1649 (!) 168/92     Pulse Rate 10/07/20 1649 70     Resp 10/07/20 1649 16     Temp 10/07/20 1649 98.2 F (36.8 C)     Temp Source 10/07/20 1649 Oral     SpO2 10/07/20 1649 97 %     Weight 10/07/20 1648 79.4 kg (175 lb)     Height 10/07/20 1648 1.626 m (5\' 4" )     Head Circumference --  Peak Flow --      Pain Score 10/07/20 1702 0     Pain Loc --      Pain Edu? --      Excl. in [place]? --     Constitutional: Alert and oriented. No acute distress. Pleasant and interactive  Nose: No congestion/rhinnorhea. Mouth/Throat: Mucous membranes are moist.   Neck:  Painless ROM Cardiovascular: Normal rate, regular rhythm. Grossly normal heart sounds.  Good peripheral circulation. Respiratory: Normal respiratory effort.  No retractions. Lungs CTAB. Gastrointestinal: Soft and nontender. No distention.  No CVA tenderness.  Musculoskeletal: No lower extremity  tenderness nor edema.  Warm and well perfused Neurologic:  Normal speech and language. No gross focal neurologic deficits are appreciated.  Skin:  Skin is warm, dry and intact. No rash noted. Psychiatric: Mood and affect are normal. Speech and behavior are normal.  ____________________________________________   LABS (all labs ordered are listed, but only abnormal results are displayed)  Labs Reviewed  BASIC METABOLIC PANEL - Abnormal; Notable for the following components:      Result Value   Creatinine, Ser 1.04 (*)    GFR, Estimated 58 (*)    All other components within normal limits  CBC  TROPONIN I (HIGH SENSITIVITY)   ____________________________________________  EKG  ED ECG REPORT I, Lavonia Drafts, the attending physician, personally viewed and interpreted this ECG.  Date: 10/07/2020  Rhythm: normal sinus rhythm QRS Axis: normal Intervals: normal ST/T Wave abnormalities: normal Narrative Interpretation: no evidence of acute ischemia  ____________________________________________  RADIOLOGY  Chest x-ray viewed by me, no infiltrate or effusion ____________________________________________   PROCEDURES  Procedure(s) performed: No  Procedures   Critical Care performed: No ____________________________________________   INITIAL IMPRESSION / ASSESSMENT AND PLAN / ED COURSE  Pertinent labs & imaging results that were available during my care of the patient were reviewed by me and considered in my medical decision making (see chart for details).  Patient well-appearing and in no acute distress, symptoms not consistent with ACS dissection PE.  EKG is reassuring, high sensitive troponin is normal, white blood cell count is normal.  No evidence of pneumonia  Suspect possible chest wall pain versus gastritis, will treat supportively, appropriate for discharge with outpatient follow-up    ____________________________________________   FINAL CLINICAL  IMPRESSION(S) / ED DIAGNOSES  Final diagnoses:  Atypical chest pain        Note:  This document was prepared using Dragon voice recognition software and may include unintentional dictation errors.   Lavonia Drafts, MD 10/07/20 2303

## 2020-10-12 ENCOUNTER — Other Ambulatory Visit: Payer: Self-pay

## 2020-10-12 ENCOUNTER — Ambulatory Visit: Payer: 59 | Admitting: Gastroenterology

## 2020-10-12 ENCOUNTER — Encounter: Payer: Self-pay | Admitting: Gastroenterology

## 2020-10-12 VITALS — BP 155/79 | HR 71 | Temp 98.0°F | Ht 63.0 in | Wt 174.0 lb

## 2020-10-12 DIAGNOSIS — R42 Dizziness and giddiness: Secondary | ICD-10-CM | POA: Insufficient documentation

## 2020-10-12 DIAGNOSIS — T39395A Adverse effect of other nonsteroidal anti-inflammatory drugs [NSAID], initial encounter: Secondary | ICD-10-CM

## 2020-10-12 DIAGNOSIS — R1013 Epigastric pain: Secondary | ICD-10-CM

## 2020-10-12 DIAGNOSIS — C539 Malignant neoplasm of cervix uteri, unspecified: Secondary | ICD-10-CM | POA: Insufficient documentation

## 2020-10-12 DIAGNOSIS — K2960 Other gastritis without bleeding: Secondary | ICD-10-CM | POA: Diagnosis not present

## 2020-10-12 NOTE — Progress Notes (Signed)
Gina Bellows MD, MRCP(U.K) [address]  [place]  [place], [place] [address]  Main: [phone]  Fax: [phone]   Gastroenterology Consultation  Referring Provider:     No ref. provider found Primary Care Physician:  Patient, No Pcp Per Primary Gastroenterologist:  Dr. Jonathon Charles  Reason for Consultation:     ER referral        HPI:   Gina Charles is a 70 y.o. y/o female presented to the ER 5 days back on 10/07/2020 with epigastric discomfort/chest pain.  It is burning in nature and woke her up from her sleep.  At the ER she was seen by Dr. Corky Charles and gastritis was suspected treated symptomatically and discharged to follow-up with me.  She states that she was woken up from the middle of her sleep with sharp pain in the epigastric area nonradiating.  Resolved after taking the medication she was given in the emergency room.  Since then has not recurred.  1 similar episode in the past very long time back.  No other complaints.  She takes ibuprofen daily at night for many years for difficulty sleeping.   Past Medical History:  Diagnosis Date  . Cancer ([place])   . Cervical cancer ([place])   . Heart murmur   . Vertigo     Past Surgical History:  Procedure Laterality Date  . ABDOMINAL HYSTERECTOMY    . CATARACT EXTRACTION W/PHACO Right 07/09/2019   Procedure: CATARACT EXTRACTION PHACO AND INTRAOCULAR LENS PLACEMENT (IOC);  Surgeon: Marchia Meiers, MD;  Location: [facility] ORS;  Service: Ophthalmology;  Laterality: Right;  [place] 00:55.1 CDE 10.40 Fluid Pack Lot # [id number]  . CATARACT EXTRACTION W/PHACO Left 07/30/2019   Procedure: CATARACT EXTRACTION PHACO AND INTRAOCULAR LENS PLACEMENT ([place]) LEFT;  Surgeon: Marchia Meiers, MD;  Location: [facility] ORS;  Service: Ophthalmology;  Laterality: Left;  Lot #[id number] H [place]: 00:44.5 CDE: 7.07  . cervical cancer surgery      Prior to Admission medications   Medication Sig Start Date End Date Taking? Authorizing Provider  CALCIUM PO Take 1 capsule by  mouth every other day.   Yes [provider]  ibuprofen (ADVIL) 200 MG tablet Take 400 mg by mouth every 6 (six) hours as needed for headache or moderate pain.   Yes [provider]  Multiple Vitamin (MULTIVITAMIN WITH MINERALS) TABS tablet Take 1 tablet by mouth daily.   Yes [provider]  Carboxymethylcellul-Glycerin (LUBRICATING EYE DROPS OP) Place 1 drop into both eyes daily as needed (dry eyes). Patient not taking: Reported on 10/12/2020    [provider]  pantoprazole (PROTONIX) 20 MG tablet Take 1 tablet (20 mg total) by mouth daily. Patient not taking: Reported on 10/12/2020 10/07/20 10/07/21  Lavonia Drafts, MD    History reviewed. No pertinent family history.   Social History   Tobacco Use  . Smoking status: Never Smoker  . Smokeless tobacco: Never Used  Vaping Use  . Vaping Use: Never used  Substance Use Topics  . Alcohol use: Never  . Drug use: Never    Allergies as of 10/12/2020  . (No Known Allergies)    Review of Systems:    All systems reviewed and negative except where noted in HPI.   Physical Exam:  BP (!) 155/79   Pulse 71   Temp 98 F (36.7 C) (Oral)   Ht 5\' 3"  (1.6 m)   Wt 174 lb (78.9 kg)   BMI 30.82 kg/m  No LMP recorded. Patient has had  a hysterectomy. Psych:  Alert and cooperative. Normal mood and affect. General:   Alert,  Well-developed, well-nourished, pleasant and cooperative in NAD Head:  Normocephalic and atraumatic. Eyes:  Sclera clear, no icterus.   Conjunctiva pink. Lungs:  Respirations even and unlabored.  Clear throughout to auscultation.   No wheezes, crackles, or rhonchi. No acute distress. Heart:  Regular rate and rhythm; no murmurs, clicks, rubs, or gallops. Abdomen:  Normal bowel sounds.  No bruits.  Soft, non-tender and non-distended without masses, hepatosplenomegaly or hernias noted.  No guarding or rebound tenderness.    Neurologic:  Alert and oriented x3;  grossly normal  neurologically. Psych:  Alert and cooperative. Normal mood and affect.  Imaging Studies: DG Chest 2 View  Result Date: 10/07/2020 CLINICAL DATA:  Chest pain EXAM: CHEST - 2 VIEW COMPARISON:  None. FINDINGS: Lungs are clear.  No pleural effusion or pneumothorax. The heart is normal in size. Mild degenerative changes of the mid thoracic spine. IMPRESSION: Normal chest radiographs. Electronically Signed   By: Julian Hy M.D.   On: 10/07/2020 17:55    Assessment and Plan:   Gina Charles is a 71 y.o. y/o female has been referred for epigastric pain that woke her up from her sleep few days back and brought her to the ER.  Treated with Protonix 20 mg a day since then the pain is resolved.  Long-term use of ibuprofen daily at night to help with her sleep.  Differentials include pill esophagitis versus gastritis and epigastric pain due to NSAID use.  Advised to stop using NSAIDs as a treatment for insomnia.  Suggest to use her Protonix till she runs out of them and watch worsening symptoms.  If they recur we will consider endoscopy.  Follow up in 4 weeks to discuss any recurrence of symptoms  Dr Gina Bellows MD,MRCP(U.K)

## 2020-11-08 ENCOUNTER — Ambulatory Visit: Payer: 59 | Admitting: Gastroenterology

## 2020-11-08 ENCOUNTER — Other Ambulatory Visit: Payer: Self-pay

## 2020-11-08 VITALS — BP 151/96 | HR 72 | Temp 98.3°F | Ht 63.0 in | Wt 174.0 lb

## 2020-11-08 DIAGNOSIS — Z1211 Encounter for screening for malignant neoplasm of colon: Secondary | ICD-10-CM

## 2020-11-08 DIAGNOSIS — R1013 Epigastric pain: Secondary | ICD-10-CM | POA: Diagnosis not present

## 2020-11-08 MED ORDER — PEG 3350-KCL-NABCB-NACL-NASULF 236 G PO SOLR: ORAL | 0 refills | Status: AC

## 2020-11-08 NOTE — Progress Notes (Signed)
Jonathon Bellows MD, MRCP(U.K) [address]  [place]  [place], [place] [address]  Main: [phone]  Fax: [phone]   Primary Care Physician: Patient, No Pcp Per  Primary Gastroenterologist:  Dr. Jonathon Bellows   Follow-up for abdominal pain  HPI: Gina Charles is a 70 y.o. female     Summary of history : She was initially referred and seen on 10/12/2020 for epigastric discomfort and chest pain after visiting the ER.  It was burning in nature and woke her up from sleep.  She has been taking ibuprofen daily at night for many years with difficulty sleeping.  My impression was that she had NSAID induced epigastric discomfort due to gastritis   Interval history   10/12/2020-11/08/2020  Since she stopped all the NSAIDs and commenced the Protonix she has had absolutely no abdominal pain.  Denies prior history of colon cancer screening including colonoscopy.  No family history of colon cancer or polyps.  Not on any blood thinners.   Current Outpatient Medications  Medication Sig Dispense Refill  . CALCIUM PO Take 1 capsule by mouth every other day.    . Carboxymethylcellul-Glycerin (LUBRICATING EYE DROPS OP) Place 1 drop into both eyes daily as needed (dry eyes). (Patient not taking: Reported on 10/12/2020)    . ibuprofen (ADVIL) 200 MG tablet Take 400 mg by mouth every 6 (six) hours as needed for headache or moderate pain.    . Multiple Vitamin (MULTIVITAMIN WITH MINERALS) TABS tablet Take 1 tablet by mouth daily.    . pantoprazole (PROTONIX) 20 MG tablet Take 1 tablet (20 mg total) by mouth daily. (Patient not taking: Reported on 10/12/2020) 30 tablet 1   No current facility-administered medications for this visit.    Allergies as of 11/08/2020  . (No Known Allergies)    ROS:  General: Negative for anorexia, weight loss, fever, chills, fatigue, weakness. ENT: Negative for hoarseness, difficulty swallowing , nasal congestion. CV: Negative for chest pain, angina, palpitations,  dyspnea on exertion, peripheral edema.  Respiratory: Negative for dyspnea at rest, dyspnea on exertion, cough, sputum, wheezing.  GI: See history of present illness. GU:  Negative for dysuria, hematuria, urinary incontinence, urinary frequency, nocturnal urination.    Endo: Negative for unusual weight change.    Physical Examination:   BP (!) 151/96   Pulse 72   Temp 98.3 F (36.8 C)   Ht 5\' 3"  (1.6 m)   Wt 174 lb (78.9 kg)   BMI 30.82 kg/m   General: Well-nourished, well-developed in no acute distress.  Eyes: No icterus. Conjunctivae pink. Mouth: Oropharyngeal mucosa moist and pink , no lesions erythema or exudate. Lungs: Clear to auscultation bilaterally. Non-labored. Heart: Regular rate and rhythm, no murmurs rubs or gallops.  Abdomen: Bowel sounds are normal, nontender, nondistended, no hepatosplenomegaly or masses, no abdominal bruits or hernia , no rebound or guarding.   Extremities: No lower extremity edema. No clubbing or deformities. Neuro: Alert and oriented x 3.  Grossly intact. Skin: Warm and dry, no jaundice.   Psych: Alert and cooperative, normal mood and affect.   Imaging Studies: No results found.  Assessment and Plan:   Gina Charles is a 70 y.o. y/o female \\here  to follow-up for presumed NSAID related gastritis.  Plan 1.  Continue Protonix and stop when complete the course as she has stopped using NSAIDs which are likely the cause of her epigastric discomfort  2.  Colonoscopy for colon cancer screening average risk   I have discussed alternative options,  risks & benefits,  which include, but are not limited to, bleeding, infection, perforation,respiratory complication & drug reaction.  The patient agrees with this plan & written consent will be obtained.     Dr Jonathon Bellows  MD,MRCP [facility]) Follow up in as needed

## 2020-11-11 ENCOUNTER — Other Ambulatory Visit: Payer: Self-pay

## 2020-11-11 ENCOUNTER — Other Ambulatory Visit
Admission: RE | Admit: 2020-11-11 | Discharge: 2020-11-11 | Disposition: A | Discharge status: Home / Self Care | Payer: 59 | Source: Ambulatory Visit | Attending: Gastroenterology | Admitting: Gastroenterology

## 2020-11-11 DIAGNOSIS — Z20822 Contact with and (suspected) exposure to covid-19: Secondary | ICD-10-CM | POA: Insufficient documentation

## 2020-11-11 DIAGNOSIS — Z01812 Encounter for preprocedural laboratory examination: Secondary | ICD-10-CM | POA: Diagnosis present

## 2020-11-11 LAB — SARS CORONAVIRUS 2 (TAT 6-24 HRS): SARS Coronavirus 2: NEGATIVE

## 2020-11-14 ENCOUNTER — Encounter: Payer: Self-pay | Admitting: Gastroenterology

## 2020-11-15 ENCOUNTER — Ambulatory Visit: Payer: 59 | Admitting: Certified Registered"

## 2020-11-15 ENCOUNTER — Encounter
Admission: RE | Disposition: A | Discharge status: Home / Self Care | Payer: Self-pay | Source: Home / Self Care | Attending: Gastroenterology

## 2020-11-15 ENCOUNTER — Encounter: Payer: Self-pay | Admitting: Gastroenterology

## 2020-11-15 ENCOUNTER — Other Ambulatory Visit: Payer: Self-pay

## 2020-11-15 ENCOUNTER — Ambulatory Visit
Admission: RE | Admit: 2020-11-15 | Discharge: 2020-11-15 | Disposition: A | Discharge status: Home / Self Care | Payer: 59 | Attending: Gastroenterology | Admitting: Gastroenterology

## 2020-11-15 DIAGNOSIS — D125 Benign neoplasm of sigmoid colon: Secondary | ICD-10-CM | POA: Diagnosis not present

## 2020-11-15 DIAGNOSIS — Z79899 Other long term (current) drug therapy: Secondary | ICD-10-CM | POA: Insufficient documentation

## 2020-11-15 DIAGNOSIS — Z8541 Personal history of malignant neoplasm of cervix uteri: Secondary | ICD-10-CM | POA: Insufficient documentation

## 2020-11-15 DIAGNOSIS — Z961 Presence of intraocular lens: Secondary | ICD-10-CM | POA: Diagnosis not present

## 2020-11-15 DIAGNOSIS — K635 Polyp of colon: Secondary | ICD-10-CM | POA: Insufficient documentation

## 2020-11-15 DIAGNOSIS — Z9842 Cataract extraction status, left eye: Secondary | ICD-10-CM | POA: Insufficient documentation

## 2020-11-15 DIAGNOSIS — Z9841 Cataract extraction status, right eye: Secondary | ICD-10-CM | POA: Diagnosis not present

## 2020-11-15 DIAGNOSIS — R42 Dizziness and giddiness: Secondary | ICD-10-CM | POA: Diagnosis not present

## 2020-11-15 DIAGNOSIS — Z1211 Encounter for screening for malignant neoplasm of colon: Secondary | ICD-10-CM | POA: Insufficient documentation

## 2020-11-15 HISTORY — PX: COLONOSCOPY WITH PROPOFOL: SHX5780

## 2020-11-15 SURGERY — COLONOSCOPY WITH PROPOFOL
Anesthesia: General

## 2020-11-15 MED ORDER — PROPOFOL 500 MG/50ML IV EMUL
INTRAVENOUS | Status: AC
  Filled 2020-11-15: qty 50

## 2020-11-15 MED ORDER — PROPOFOL 10 MG/ML IV BOLUS
INTRAVENOUS | Status: AC
  Filled 2020-11-15: qty 20

## 2020-11-15 MED ORDER — PROPOFOL 10 MG/ML IV BOLUS
INTRAVENOUS | Status: DC | PRN
  Administered 2020-11-15: 80 mg via INTRAVENOUS

## 2020-11-15 MED ORDER — SODIUM CHLORIDE 0.9 % IV SOLN: INTRAVENOUS | Status: DC

## 2020-11-15 MED ORDER — PROPOFOL 500 MG/50ML IV EMUL
INTRAVENOUS | Status: DC | PRN
  Administered 2020-11-15: 150 ug/kg/min via INTRAVENOUS

## 2020-11-15 NOTE — Transfer of Care (Signed)
Immediate Anesthesia Transfer of Care Note  Patient: Gina Charles  Procedure(s) Performed: COLONOSCOPY WITH PROPOFOL (N/A )  Patient Location: PACU and Endoscopy Unit  Anesthesia Type:General  Level of Consciousness: drowsy  Airway & Oxygen Therapy: Patient Spontanous Breathing  Post-op Assessment: Report given to RN  Post vital signs: stable  Last Vitals:  Vitals Value Taken Time  BP    Temp    Pulse    Resp    SpO2      Last Pain:  Vitals:   11/15/20 0825  TempSrc: Temporal  PainSc: 0-No pain         Complications: No complications documented.

## 2020-11-15 NOTE — Anesthesia Postprocedure Evaluation (Signed)
Anesthesia Post Note  Patient: Gina Charles  Procedure(s) Performed: COLONOSCOPY WITH PROPOFOL (N/A )  Patient location during evaluation: Endoscopy Anesthesia Type: General Level of consciousness: awake and alert Pain management: pain level controlled Vital Signs Assessment: post-procedure vital signs reviewed and stable Respiratory status: spontaneous breathing, nonlabored ventilation, respiratory function stable and patient connected to nasal cannula oxygen Cardiovascular status: blood pressure returned to baseline and stable Postop Assessment: no apparent nausea or vomiting Anesthetic complications: no   No complications documented.   Last Vitals:  Vitals:   11/15/20 0857 11/15/20 0927  BP: 127/70 (!) 156/73  Pulse:    Resp:    Temp:    SpO2:      Last Pain:  Vitals:   11/15/20 0927  TempSrc:   PainSc: 0-No pain                 Precious Haws Nia Nathaniel

## 2020-11-15 NOTE — Op Note (Signed)
[facility] Gastroenterology Patient Name: Gina Charles Procedure Date: 11/15/2020 8:36 AM MRN: [id number] Account #: [id number] Date of Birth: 07-05-1951 Admit Type: Outpatient Age: 70 Room: [facility] ENDO ROOM 2 Gender: Female Note Status: Finalized Procedure:             Colonoscopy Indications:           Screening for colorectal malignant neoplasm Providers:             Jonathon Bellows MD, MD Referring MD:          No Local Md, MD (Referring MD) Medicines:             Monitored Anesthesia Care Complications:         No immediate complications. Procedure:             Pre-Anesthesia Assessment:                        - Prior to the procedure, a History and Physical was                         performed, and patient medications, allergies and                         sensitivities were reviewed. The patient's tolerance                         of previous anesthesia was reviewed.                        - The risks and benefits of the procedure and the                         sedation options and risks were discussed with the                         patient. All questions were answered and informed                         consent was obtained.                        - ASA Grade Assessment: II - A patient with mild                         systemic disease.                        After obtaining informed consent, the colonoscope was                         passed under direct vision. Throughout the procedure,                         the patient's blood pressure, pulse, and oxygen                         saturations were monitored continuously. The                         Colonoscope was introduced through the anus  and                         advanced to the the cecum, identified by the                         appendiceal orifice. The colonoscopy was performed                         with ease. The patient tolerated the procedure well.                         The quality of the  bowel preparation was excellent. Findings:      The perianal and digital rectal examinations were normal.      A 5 mm polyp was found in the sigmoid colon. The polyp was sessile. The       polyp was removed with a cold snare. Resection and retrieval were       complete.      The exam was otherwise without abnormality on direct and retroflexion       views. Impression:            - One 5 mm polyp in the sigmoid colon, removed with a                         cold snare. Resected and retrieved.                        - The examination was otherwise normal on direct and                         retroflexion views. Recommendation:        - Discharge patient to home (with escort).                        - Resume previous diet.                        - Continue present medications.                        - Await pathology results.                        - Repeat colonoscopy for surveillance based on                         pathology results. Procedure Code(s):     --- Professional ---                        [phone], Colonoscopy, flexible; with removal of                         tumor(s), polyp(s), or other lesion(s) by snare                         technique Diagnosis Code(s):     --- Professional ---                        Z12.11, Encounter for  screening for malignant neoplasm                         of colon                        K63.5, Polyp of colon CPT copyright 2019 American Medical Association. All rights reserved. The codes documented in this report are preliminary and upon coder review may  be revised to meet current compliance requirements. Jonathon Bellows, MD Jonathon Bellows MD, MD 11/15/2020 8:56:14 AM This report has been signed electronically. Number of Addenda: 0 Note Initiated On: 11/15/2020 8:36 AM Scope Withdrawal Time: 0 hours 9 minutes 3 seconds  Total Procedure Duration: 0 hours 12 minutes 59 seconds  Estimated Blood Loss:  Estimated blood loss: none.      [facility]

## 2020-11-15 NOTE — Anesthesia Preprocedure Evaluation (Signed)
Anesthesia Evaluation  Patient identified by MRN, date of birth, ID band Patient awake    Reviewed: Allergy & Precautions, H&P , NPO status , Patient's Chart, lab work & pertinent test results  History of Anesthesia Complications Negative for: history of anesthetic complications  Airway Mallampati: III  TM Distance: <3 FB Neck ROM: full    Dental  (+) Upper Dentures, Lower Dentures   Pulmonary neg pulmonary ROS, neg shortness of breath,    Pulmonary exam normal        Cardiovascular Exercise Tolerance: Good (-) angina(-) Past MI Normal cardiovascular exam     Neuro/Psych negative neurological ROS  negative psych ROS   GI/Hepatic negative GI ROS, Neg liver ROS, neg GERD  ,  Endo/Other  negative endocrine ROS  Renal/GU negative Renal ROS  negative genitourinary   Musculoskeletal   Abdominal   Peds  Hematology negative hematology ROS (+)   Anesthesia Other Findings Past Medical History: No date: Cancer ([place]) No date: Cervical cancer ([place]) No date: Heart murmur No date: Vertigo  Past Surgical History: No date: ABDOMINAL HYSTERECTOMY 07/09/2019: CATARACT EXTRACTION W/PHACO; Right     Comment:  Procedure: CATARACT EXTRACTION PHACO AND INTRAOCULAR               LENS PLACEMENT (I[facility]);  Surgeon: Marchia Meiers, MD;                Location: [facility] ORS;  Service: Ophthalmology;  Laterality:              Right;  [place] 00:55.1 CDE 10.40 Fluid Pack Lot # [id number] 07/30/2019: CATARACT EXTRACTION W/PHACO; Left     Comment:  Procedure: CATARACT EXTRACTION PHACO AND INTRAOCULAR               LENS PLACEMENT ([place]) LEFT;  Surgeon: Marchia Meiers, MD;                Location: [facility] ORS;  Service: Ophthalmology;  Laterality:              Left;  Lot #[id number] H [place]: 00:44.5 CDE: 7.07 No date: cervical cancer surgery  BMI    Body Mass Index: 30.82 kg/m      Reproductive/Obstetrics negative OB ROS                              Anesthesia Physical Anesthesia Plan  ASA: III  Anesthesia Plan: General   Post-op Pain Management:    Induction: Intravenous  PONV Risk Score and Plan: Propofol infusion and TIVA  Airway Management Planned: Natural Airway and Nasal Cannula  Additional Equipment:   Intra-op Plan:   Post-operative Plan:   Informed Consent: I have reviewed the patients History and Physical, chart, labs and discussed the procedure including the risks, benefits and alternatives for the proposed anesthesia with the patient or authorized representative who has indicated his/her understanding and acceptance.     Dental Advisory Given  Plan Discussed with: Anesthesiologist, CRNA and Surgeon  Anesthesia Plan Comments: (Patient consented for risks of anesthesia including but not limited to:  - adverse reactions to medications - risk of airway placement if required - damage to eyes, teeth, lips or other oral mucosa - nerve damage due to positioning  - sore throat or hoarseness - Damage to heart, brain, nerves, lungs, other parts of body or loss of life  Patient voiced understanding.)        Anesthesia Quick Evaluation

## 2020-11-15 NOTE — H&P (Signed)
Jonathon Bellows, MD [address], [place], [place], [place], [address] [address], [place], [place], [place], [address] Phone: [phone]  Fax: [phone]  Primary Care Physician:  Patient, No Pcp Per   Pre-Procedure History & Physical: HPI:  Gina Charles is a 70 y.o. female is here for an colonoscopy.   Past Medical History:  Diagnosis Date  . Cancer ([place])   . Cervical cancer ([place])   . Heart murmur   . Vertigo     Past Surgical History:  Procedure Laterality Date  . ABDOMINAL HYSTERECTOMY    . CATARACT EXTRACTION W/PHACO Right 07/09/2019   Procedure: CATARACT EXTRACTION PHACO AND INTRAOCULAR LENS PLACEMENT (IOC);  Surgeon: Marchia Meiers, MD;  Location: [facility];  Service: Ophthalmology;  Laterality: Right;  [place] 00:55.1 CDE 10.40 Fluid Pack Lot # [id number]  . CATARACT EXTRACTION W/PHACO Left 07/30/2019   Procedure: CATARACT EXTRACTION PHACO AND INTRAOCULAR LENS PLACEMENT ([place]) LEFT;  Surgeon: Marchia Meiers, MD;  Location: [facility];  Service: Ophthalmology;  Laterality: Left;  Lot #[id number] H [place]: 00:44.5 CDE: 7.07  . cervical cancer surgery      Prior to Admission medications   Medication Sig Start Date End Date Taking? Authorizing Provider  CALCIUM PO Take 1 capsule by mouth every other day.   Yes [provider]  Carboxymethylcellul-Glycerin (LUBRICATING EYE DROPS OP) Place 1 drop into both eyes daily as needed (dry eyes).   Yes [provider]  ibuprofen (ADVIL) 200 MG tablet Take 400 mg by mouth every 6 (six) hours as needed for headache or moderate pain.   Yes [provider]  Multiple Vitamin (MULTIVITAMIN WITH MINERALS) TABS tablet Take 1 tablet by mouth daily.   Yes [provider]  pantoprazole (PROTONIX) 20 MG tablet Take 1 tablet (20 mg total) by mouth daily. 10/07/20 10/07/21 Yes Lavonia Drafts, MD  polyethylene glycol (GOLYTELY) 236 g solution Drink 8 oz every 20-30 minutes until entire prep is finished Patient not taking:  Reported on 11/15/2020 11/08/20   Jonathon Bellows, MD    Allergies as of 11/08/2020  . (No Known Allergies)    History reviewed. No pertinent family history.  Social History   Socioeconomic History  . Marital status: Single    Spouse name: Not on file  . Number of children: Not on file  . Years of education: Not on file  . Highest education level: Not on file  Occupational History  . Not on file  Tobacco Use  . Smoking status: Never Smoker  . Smokeless tobacco: Never Used  Vaping Use  . Vaping Use: Never used  Substance and Sexual Activity  . Alcohol use: Never  . Drug use: Never  . Sexual activity: Not on file  Other Topics Concern  . Not on file  Social History Narrative  . Not on file   Social Determinants of Health   Financial Resource Strain: Not on file  Food Insecurity: Not on file  Transportation Needs: Not on file  Physical Activity: Not on file  Stress: Not on file  Social Connections: Not on file  Intimate Partner Violence: Not on file    Review of Systems: See HPI, otherwise negative ROS  Physical Exam: BP (!) 155/71   Pulse 73   Temp (!) 97.3 F (36.3 C) (Temporal)   Resp 18   Ht 5\' 3"  (1.6 m)   Wt 78.9 kg   SpO2 100%   BMI 30.82 kg/m  General:   Alert,  pleasant and  cooperative in NAD Head:  Normocephalic and atraumatic. Neck:  Supple; no masses or thyromegaly. Lungs:  Clear throughout to auscultation, normal respiratory effort.    Heart:  +S1, +S2, Regular rate and rhythm, No edema. Abdomen:  Soft, nontender and nondistended. Normal bowel sounds, without guarding, and without rebound.   Neurologic:  Alert and  oriented x4;  grossly normal neurologically.  Impression/Plan: Gina Charles is here for an colonoscopy to be performed for Screening colonoscopy average risk   Risks, benefits, limitations, and alternatives regarding  colonoscopy have been reviewed with the patient.  Questions have been answered.  All parties agreeable.   Jonathon Bellows,  MD  11/15/2020, 8:30 AM

## 2020-11-16 LAB — SURGICAL PATHOLOGY

## 2020-11-17 ENCOUNTER — Encounter: Payer: Self-pay | Admitting: Gastroenterology

## 2020-11-24 ENCOUNTER — Encounter: Payer: Self-pay | Admitting: Gastroenterology

## 2021-02-24 DIAGNOSIS — J069 Acute upper respiratory infection, unspecified: Secondary | ICD-10-CM | POA: Diagnosis not present

## 2021-02-24 DIAGNOSIS — Z20822 Contact with and (suspected) exposure to covid-19: Secondary | ICD-10-CM | POA: Diagnosis not present

## 2021-03-10 DIAGNOSIS — Z961 Presence of intraocular lens: Secondary | ICD-10-CM | POA: Diagnosis not present

## 2021-12-14 IMAGING — CR DG CHEST 2V
1 series · 2 of 2 positions shown · non-contrast
Comparison: None.

CLINICAL DATA: Chest pain

EXAM:
CHEST - 2 VIEW

[Series 1: dg chest 2 view · 0.14mm/px · 2 of 2 slices shown]
[im 1/2]
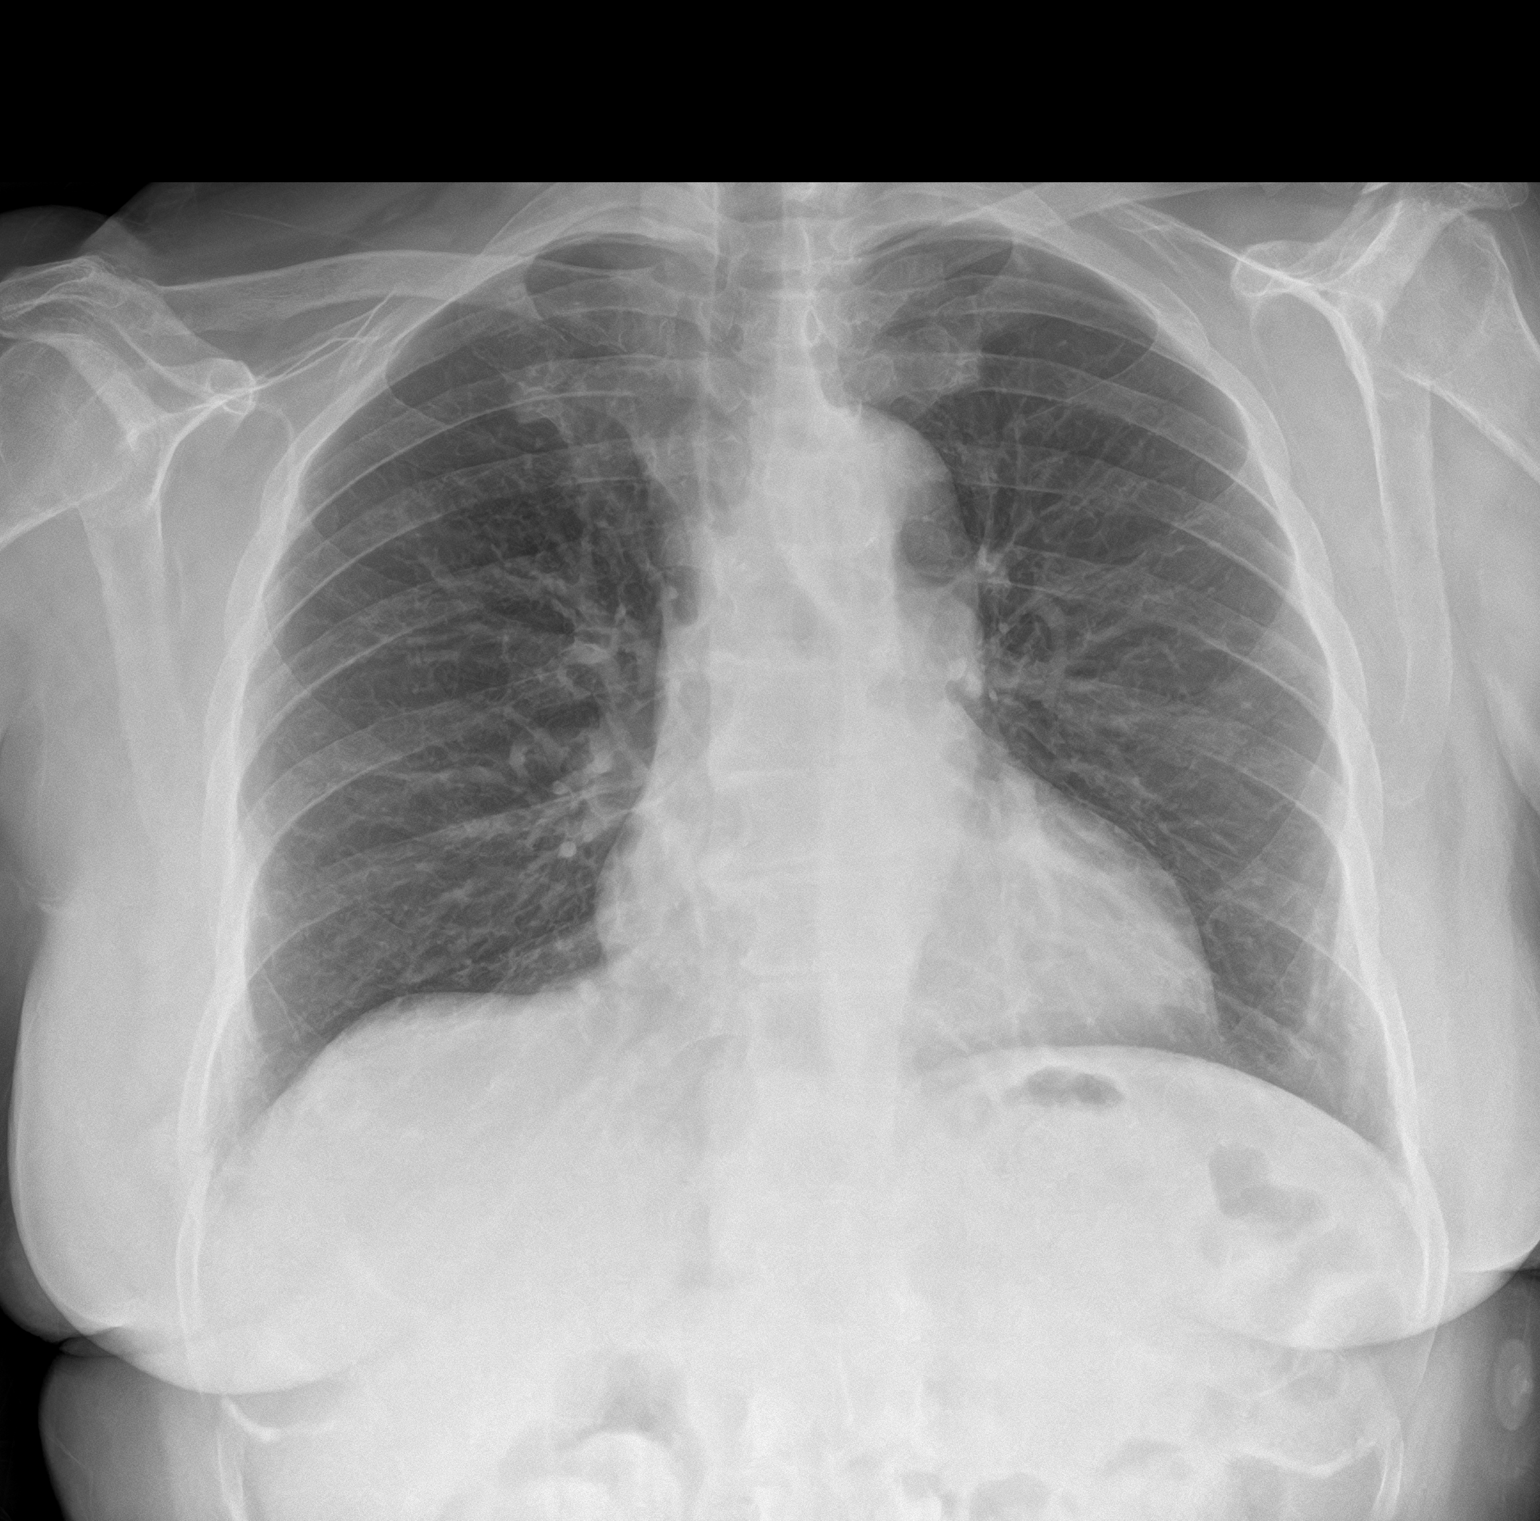
[im 2/2]
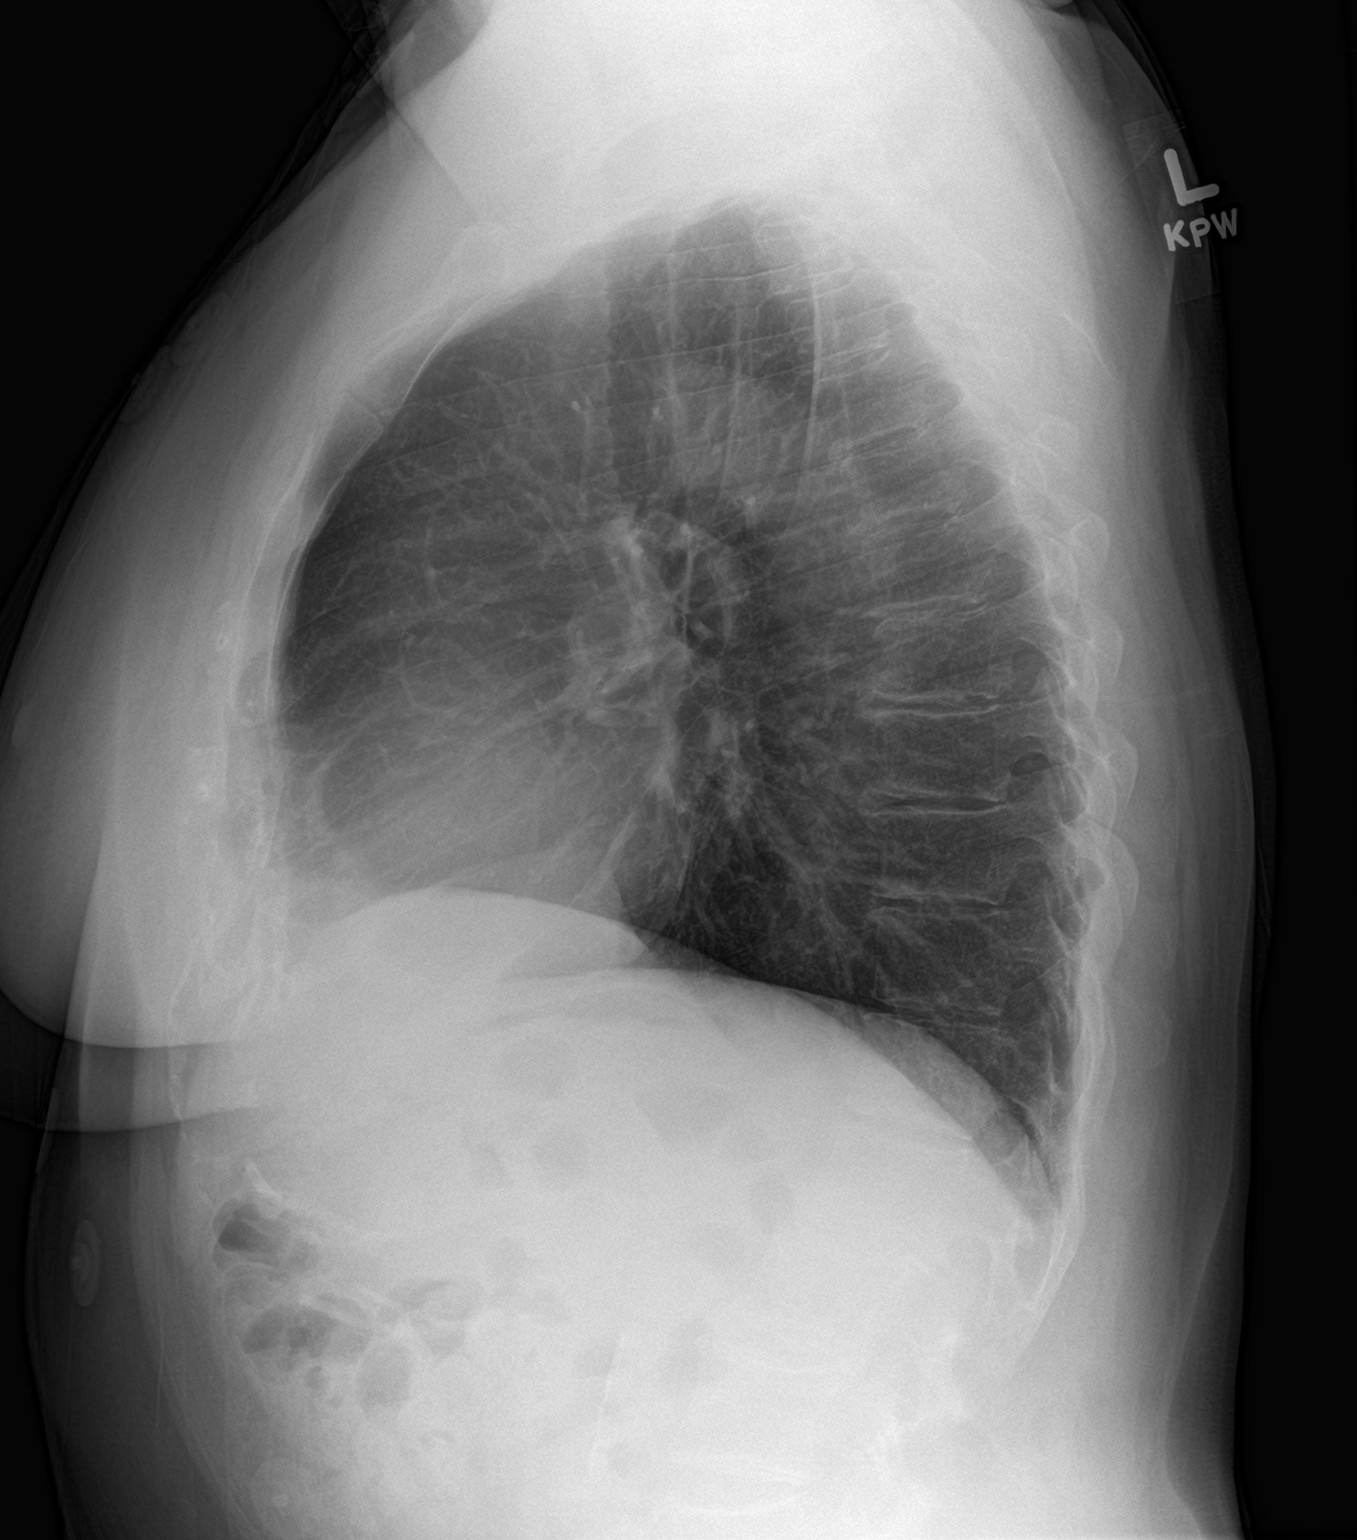

[2 of 2 positions shown; findings below may reference images not displayed]

FINDINGS: Lungs are clear.  No pleural effusion or pneumothorax.

The heart is normal in size.

Mild degenerative changes of the mid thoracic spine.
IMPRESSION: Normal chest radiographs.

## 2022-03-12 DIAGNOSIS — H43813 Vitreous degeneration, bilateral: Secondary | ICD-10-CM | POA: Diagnosis not present
